# Patient Record
Sex: Female | Born: 1965 | Race: Black or African American | Hispanic: No | Marital: Single | Smoking: Never smoker
Health system: Southern US, Community
[De-identification: ages and names within clinical notes are randomized; demographics above are authoritative.]

## PROBLEM LIST (undated history)

## (undated) DIAGNOSIS — I10 Essential (primary) hypertension: Secondary | ICD-10-CM

## (undated) DIAGNOSIS — E119 Type 2 diabetes mellitus without complications: Secondary | ICD-10-CM

## (undated) HISTORY — DX: Essential (primary) hypertension: I10

## (undated) HISTORY — PX: TUBAL LIGATION: SHX77

## (undated) HISTORY — DX: Type 2 diabetes mellitus without complications: E11.9

---
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
## 2013-10-13 ENCOUNTER — Encounter: Payer: Managed Care, Other (non HMO) | Admitting: Nurse Practitioner

## 2013-11-03 ENCOUNTER — Ambulatory Visit (INDEPENDENT_AMBULATORY_CARE_PROVIDER_SITE_OTHER): Payer: Managed Care, Other (non HMO) | Admitting: Nurse Practitioner

## 2013-11-03 ENCOUNTER — Encounter: Payer: Self-pay | Admitting: Nurse Practitioner

## 2013-11-03 VITALS — BP 120/66 | HR 80 | Resp 16 | Ht 64.5 in | Wt 209.0 lb

## 2013-11-03 DIAGNOSIS — Z0000 Encounter for general adult medical examination without abnormal findings: Secondary | ICD-10-CM

## 2013-11-03 DIAGNOSIS — Z01419 Encounter for gynecological examination (general) (routine) without abnormal findings: Secondary | ICD-10-CM

## 2013-11-03 DIAGNOSIS — E559 Vitamin D deficiency, unspecified: Secondary | ICD-10-CM

## 2013-11-03 DIAGNOSIS — E119 Type 2 diabetes mellitus without complications: Secondary | ICD-10-CM

## 2013-11-03 LAB — POCT URINALYSIS DIPSTICK
Bilirubin, UA: NEGATIVE
Glucose, UA: NEGATIVE
Ketones, UA: NEGATIVE
Leukocytes, UA: NEGATIVE
Protein, UA: NEGATIVE
pH, UA: 7

## 2013-11-03 NOTE — Progress Notes (Signed)
Patient ID: Cassandra Morgan, female   DOB: 08-Jan-1966, 47 y.o.   MRN: [id number] 47 y.o. [id number] Single African American Fe here for annual exam.  Menses lasting 3 days moderate to light.  No cramps but some PMS. Some migraine headaches in the past but not as bad as earlier years. Same partner for 10 years and they have relocated from [place] secondary to his job.  She was diagnosed as pre diabetic last year.  She would like to establish care with endocrinologist here.  Patient's last menstrual period was 10/31/2013.          Sexually active: yes  The current method of family planning is tubal ligation.    Exercising: no  The patient does not participate in regular exercise at present. Smoker:  no  Health Maintenance: Pap:  2+ years, no history of abnormal MMG:  2013, in [place] TDaP:  2012 Labs: HB: 13.1 Urine: Mod blood, pH 7.0   reports that she has never smoked. She has never used smokeless tobacco. She reports that she drinks about 1.5 ounces of alcohol per week. She reports that she does not use illicit drugs.  Past Medical History  Diagnosis Date  . Diabetes     diagnosed as prediabetic 2013    Past Surgical History  Procedure Laterality Date  . Tubal ligation    . Inguinal hernia repair Right 1987    Current Outpatient Prescriptions  Medication Sig Dispense Refill  . cholecalciferol (VITAMIN D) 1000 UNITS tablet Take 1,000 Units by mouth daily.       No current facility-administered medications for this visit.    Family History  Problem Relation Age of Onset  . Diabetes Mother   . Diabetes Father   . Diabetes Sister     ROS:  Pertinent items are noted in HPI.  Otherwise, a comprehensive ROS was negative.  Exam:   BP 120/66  Pulse 80  Resp 16  Ht 5' 4.5" (1.638 m)  Wt 209 lb (94.802 kg)  BMI 35.33 kg/m2  LMP 10/31/2013 Height: 5' 4.5" (163.8 cm)  Ht Readings from Last 3 Encounters:  11/03/13 5' 4.5" (1.638 m)    General appearance: alert,  cooperative and appears stated age Head: Normocephalic, without obvious abnormality, atraumatic Neck: no adenopathy, supple, symmetrical, trachea midline and thyroid normal to inspection and palpation Lungs: clear to auscultation bilaterally Breasts: normal appearance, no masses or tenderness Heart: regular rate and rhythm Abdomen: soft, non-tender; no masses,  no organomegaly Extremities: extremities normal, atraumatic, no cyanosis or edema Skin: Skin color, texture, turgor normal. No rashes or lesions Lymph nodes: Cervical, supraclavicular, and axillary nodes normal. No abnormal inguinal nodes palpated Neurologic: Grossly normal   Pelvic: External genitalia:  no lesions              Urethra:  normal appearing urethra with no masses, tenderness or lesions              Bartholin's and Skene's: normal                 Vagina: normal appearing vagina with normal color and discharge, no lesions              Cervix: anteverted              Pap taken: yes Bimanual Exam:  Uterus:  normal size, contour, position, consistency, mobility, non-tender              Adnexa: no mass, fullness, tenderness  Rectovaginal: Confirms               Anus:  normal sphincter tone, no lesions  A:  Well Woman with normal exam  S/P BTL  Pre diabetic, with strong FMH of DM  Vit D deficiency  P:   Pap smear as per guidelines done today   Mammogram due now and will schedule  Referral to Dr. Talmage Nap to follow with Diabetes  Counseled on breast self exam, adequate intake of calcium and vitamin D, diet and exercise return annually or prn  An After Visit Summary was printed and given to the patient.

## 2013-11-03 NOTE — Patient Instructions (Signed)

## 2013-11-04 LAB — VITAMIN D 25 HYDROXY (VIT D DEFICIENCY, FRACTURES): Vit D, 25-Hydroxy: 25 ng/mL — ABNORMAL LOW (ref 30–89)

## 2013-11-04 NOTE — Progress Notes (Signed)
Encounter reviewed by Dr. Manley Fason Silva.  

## 2013-11-05 ENCOUNTER — Other Ambulatory Visit: Payer: Self-pay | Admitting: Nurse Practitioner

## 2013-11-05 DIAGNOSIS — Z1231 Encounter for screening mammogram for malignant neoplasm of breast: Secondary | ICD-10-CM

## 2013-11-05 LAB — IPS PAP TEST WITH HPV

## 2013-11-11 ENCOUNTER — Other Ambulatory Visit: Payer: Self-pay | Admitting: Nurse Practitioner

## 2013-11-11 MED ORDER — VITAMIN D (ERGOCALCIFEROL) 1.25 MG (50000 UNIT) PO CAPS: 50000.0000 [IU] | ORAL_CAPSULE | ORAL | Status: DC

## 2013-11-11 NOTE — Progress Notes (Signed)
Vitamin D rx sent per result note orders.

## 2013-11-17 ENCOUNTER — Encounter: Payer: Self-pay | Admitting: Certified Nurse Midwife

## 2013-11-17 ENCOUNTER — Telehealth: Payer: Self-pay | Admitting: Certified Nurse Midwife

## 2013-11-17 ENCOUNTER — Ambulatory Visit (INDEPENDENT_AMBULATORY_CARE_PROVIDER_SITE_OTHER): Payer: Managed Care, Other (non HMO) | Admitting: Certified Nurse Midwife

## 2013-11-17 ENCOUNTER — Ambulatory Visit: Payer: Managed Care, Other (non HMO) | Admitting: Certified Nurse Midwife

## 2013-11-17 VITALS — BP 120/78 | HR 68 | Resp 16 | Ht 64.75 in | Wt 212.0 lb

## 2013-11-17 DIAGNOSIS — N390 Urinary tract infection, site not specified: Secondary | ICD-10-CM

## 2013-11-17 DIAGNOSIS — B373 Candidiasis of vulva and vagina: Secondary | ICD-10-CM

## 2013-11-17 LAB — POCT URINALYSIS DIPSTICK
Bilirubin, UA: NEGATIVE
Glucose, UA: NEGATIVE
Nitrite, UA: NEGATIVE
pH, UA: 5

## 2013-11-17 MED ORDER — FLUCONAZOLE 150 MG PO TABS: ORAL_TABLET | ORAL | Status: DC

## 2013-11-17 NOTE — Progress Notes (Signed)
S:  47 y.o.Single Caucasian female presents with UTI  symptoms of urinary frequency, urinary urgency. Onset  11/07/13.  Patient denies urinary pain, fever or chills. Complaining of occasional itching with slight discharge. No new personal products. No STD concerns or testing needed. Not related to coitus. ROS: feels well  O alert, oriented to person, place, and time  Skin warm and dry   CVAT negative bilateral  Abdomen: non tender, negative suprapubic  Pelvic exam: BUS, urethral meatus and bladder not tender, external genital area no lesions  Vagina white discharge, no odor, wet prep taken  Cervix: non tender,normal  Uterus: normal, non tender  Adnexa: no masses, non tender   Wet prep: positive for yeast  Poct urine-wbc trace  Assessment: UTI not symptomatic now Yeast vaginitis probable cause of frequency P: Reviewed findings of no evidence of UTI, but will send culture. Lab: Urine culture Reviewed findings of yeast and should improve with Rx. Rx Diflucan 150 mg see order  Maintain adequate hydration. Follow up if symptoms not improving, and as needed.    Rv prn

## 2013-11-17 NOTE — Telephone Encounter (Signed)
Patient moved her 10:30 appt to 4:00 today

## 2013-11-18 NOTE — Progress Notes (Signed)
Note reviewed, agree with plan.  Chanler Schreiter, MD  

## 2013-11-20 LAB — URINE CULTURE

## 2013-11-24 ENCOUNTER — Other Ambulatory Visit: Payer: Self-pay | Admitting: *Deleted

## 2013-11-24 MED ORDER — CIPROFLOXACIN HCL 500 MG PO TABS: 500.0000 mg | ORAL_TABLET | Freq: Two times a day (BID) | ORAL | Status: DC

## 2013-11-24 NOTE — Progress Notes (Signed)
Pt notified of culture results.  Pt voices understanding and is agreeable with plan.  Cipro called to [facility] per result note.

## 2014-10-25 ENCOUNTER — Encounter: Payer: Self-pay | Admitting: Certified Nurse Midwife

## 2014-11-04 ENCOUNTER — Ambulatory Visit: Payer: Managed Care, Other (non HMO) | Admitting: Nurse Practitioner

## 2014-11-04 ENCOUNTER — Encounter: Payer: Self-pay | Admitting: Nurse Practitioner

## 2014-12-06 DIAGNOSIS — E1169 Type 2 diabetes mellitus with other specified complication: Secondary | ICD-10-CM | POA: Insufficient documentation

## 2014-12-06 DIAGNOSIS — E785 Hyperlipidemia, unspecified: Secondary | ICD-10-CM

## 2015-05-25 ENCOUNTER — Other Ambulatory Visit: Payer: Self-pay | Admitting: Nurse Practitioner

## 2015-05-25 DIAGNOSIS — Z1231 Encounter for screening mammogram for malignant neoplasm of breast: Secondary | ICD-10-CM

## 2015-07-13 ENCOUNTER — Ambulatory Visit (INDEPENDENT_AMBULATORY_CARE_PROVIDER_SITE_OTHER): Payer: Managed Care, Other (non HMO) | Admitting: Nurse Practitioner

## 2015-07-13 ENCOUNTER — Encounter: Payer: Self-pay | Admitting: Nurse Practitioner

## 2015-07-13 VITALS — BP 100/60 | HR 80 | Ht 64.25 in | Wt 208.0 lb

## 2015-07-13 DIAGNOSIS — Z0000 Encounter for general adult medical examination without abnormal findings: Secondary | ICD-10-CM | POA: Diagnosis not present

## 2015-07-13 DIAGNOSIS — Z01419 Encounter for gynecological examination (general) (routine) without abnormal findings: Secondary | ICD-10-CM | POA: Diagnosis not present

## 2015-07-13 NOTE — Progress Notes (Signed)
Patient ID: Cassandra Morgan, female   DOB: [birth year], 49 y.o.   MRN: [id number] 49 y.o. [id number] Married  African American Fe here for annual exam.  Now diagnosed with diabetes for 09/2014.  Menses is regular lasting 3-4 days. Now increase in migraine headaches 2 days before menses.  Excedrin Migraine prn.  She is currently being treated for fungal infection with a medicated powder under the breast.   They are quite heavy and large.  She often times has neck and shoulder pain from this.  Patient's last menstrual period was 06/29/2015 (exact date).          Sexually active: Yes.    The current method of family planning is tubal ligation.    Exercising: Yes.    walking everyday at work Smoker:  no  Health Maintenance: Pap:  11/03/13, Negative with neg HR HPV MMG:  06/30/15, Bi-Rads 1:  Negative TDaP:  2012 Labs:  HB:  05/2015 with PCP (Care Everywhere)  Urine:  + glucose   reports that she has never smoked. She has never used smokeless tobacco. She reports that she drinks about 1.5 - 2.0 oz of alcohol per week. She reports that she does not use illicit drugs.  Past Medical History  Diagnosis Date  . Diabetes     diagnosed as prediabetic 2013    Past Surgical History  Procedure Laterality Date  . Tubal ligation    . Inguinal hernia repair Right 1987    Current Outpatient Prescriptions  Medication Sig Dispense Refill  . atorvastatin (LIPITOR) 10 MG tablet Take 10 mg by mouth daily.    . empagliflozin (JARDIANCE) 10 MG TABS tablet Take 10 mg by mouth daily.     No current facility-administered medications for this visit.    Family History  Problem Relation Age of Onset  . Diabetes Mother   . Diabetes Father   . Diabetes Sister   . Diabetes Other     ROS:  Pertinent items are noted in HPI.  Otherwise, a comprehensive ROS was negative.  Exam:   BP 100/60 mmHg  Pulse 80  Ht 5' 4.25" (1.632 m)  Wt 208 lb (94.348 kg)  BMI 35.42 kg/m2  LMP 06/29/2015 (Exact Date) Height: 5'  4.25" (163.2 cm) Ht Readings from Last 3 Encounters:  07/13/15 5' 4.25" (1.632 m)  11/17/13 5' 4.75" (1.645 m)  11/03/13 5' 4.5" (1.638 m)    General appearance: alert, cooperative and appears stated age Head: Normocephalic, without obvious abnormality, atraumatic Neck: no adenopathy, supple, symmetrical, trachea midline and thyroid normal to inspection and palpation Lungs: clear to auscultation bilaterally Breasts: normal appearance, no masses or tenderness, red prickly rash under both breast Heart: regular rate and rhythm Abdomen: soft, non-tender; no masses,  no organomegaly Extremities: extremities normal, atraumatic, no cyanosis or edema Skin: Skin color, texture, turgor normal. No rashes or lesions  Areas of dry irritative rash mid back from bra wear. Lymph nodes: Cervical, supraclavicular, and axillary nodes normal. No abnormal inguinal nodes palpated Neurologic: Grossly normal   Pelvic: External genitalia:  no lesions              Urethra:  normal appearing urethra with no masses, tenderness or lesions              Bartholin's and Skene's: normal                 Vagina: normal appearing vagina with normal color and discharge, no lesions  Cervix: anteverted              Pap taken: Yes.   Bimanual Exam:  Uterus:  normal size, contour, position, consistency, mobility, non-tender              Adnexa: no mass, fullness, tenderness               Rectovaginal: Confirms               Anus:  normal sphincter tone, no lesions  Chaperone present:  yes  A:  Well Woman with normal exam  S/P BTL   New diagnosis of  Diabetes 09/2014   Strong FMH of DM  Vit D deficiency  Fungal infection under breast   P:   Reviewed health and wellness pertinent to exam  Pap smear as above  Mammogram is due 06/2016  Follow with pap  Counseled on breast self exam, mammography screening, adequate intake of calcium and vitamin D, diet and exercise return  annually or prn  An After Visit Summary was printed and given to the patient.

## 2015-07-13 NOTE — Patient Instructions (Signed)

## 2015-07-15 LAB — IPS PAP TEST WITH HPV

## 2015-07-20 NOTE — Progress Notes (Signed)
Encounter reviewed by Dr. Dom Haverland Amundson C. Silva.  

## 2015-12-02 DIAGNOSIS — F419 Anxiety disorder, unspecified: Secondary | ICD-10-CM | POA: Insufficient documentation

## 2015-12-02 DIAGNOSIS — F331 Major depressive disorder, recurrent, moderate: Secondary | ICD-10-CM | POA: Insufficient documentation

## 2016-07-20 ENCOUNTER — Ambulatory Visit: Payer: Managed Care, Other (non HMO) | Admitting: Nurse Practitioner

## 2016-09-03 ENCOUNTER — Ambulatory Visit: Payer: Managed Care, Other (non HMO) | Admitting: Nurse Practitioner

## 2016-09-03 ENCOUNTER — Telehealth: Payer: Self-pay | Admitting: Nurse Practitioner
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
## 2016-10-22 ENCOUNTER — Ambulatory Visit: Payer: Managed Care, Other (non HMO) | Admitting: Nurse Practitioner

## 2017-02-08 ENCOUNTER — Encounter: Payer: Self-pay | Admitting: Nurse Practitioner

## 2017-02-08 ENCOUNTER — Ambulatory Visit: Payer: Managed Care, Other (non HMO) | Admitting: Nurse Practitioner

## 2017-02-08 NOTE — Progress Notes (Deleted)
Patient ID: Cassandra Morgan, female   DOB: Feb 05, 1966, 51 y.o.   MRN: [id number]  51 y.o. [id number] Single  {Race/ethnicity:17218} Fe here for annual exam.    No LMP recorded.          Sexually active: {yes no:314532}  The current method of family planning is tubal ligation.    Exercising: {yes no:314532}  {types:19826} Smoker:  no  Health Maintenance: Pap: 07/13/15, Negative with neg HR HPV  11/03/13, Negative with neg HR HPV MMG: 06/30/15, Bi-Rads 1: Negative (Care Everywhere) TDaP: 2012 Shingles: Not indicated due to age Pneumonia: 09/23/12 Pneumovax Hep C and HIV: *** Labs: ***   reports that she has never smoked. She has never used smokeless tobacco. She reports that she drinks about 1.5 - 2.0 oz of alcohol per week . She reports that she does not use drugs.  Past Medical History:  Diagnosis Date  . Diabetes [facility](HCC)    diagnosed as prediabetic 2013    Past Surgical History:  Procedure Laterality Date  . INGUINAL HERNIA REPAIR Right 1987  . TUBAL LIGATION      Current Outpatient Prescriptions  Medication Sig Dispense Refill  . atorvastatin (LIPITOR) 10 MG tablet Take 10 mg by mouth daily.    . empagliflozin (JARDIANCE) 10 MG TABS tablet Take 10 mg by mouth daily.     No current facility-administered medications for this visit.     Family History  Problem Relation Age of Onset  . Diabetes Mother   . Diabetes Father   . Diabetes Sister   . Diabetes Other     ROS:  Pertinent items are noted in HPI.  Otherwise, a comprehensive ROS was negative.  Exam:   There were no vitals taken for this visit.   Ht Readings from Last 3 Encounters:  07/13/15 5' 4.25" (1.632 m)  11/17/13 5' 4.75" (1.645 m)  11/03/13 5' 4.5" (1.638 m)    General appearance: alert, cooperative and appears stated age Head: Normocephalic, without obvious abnormality, atraumatic Neck: no adenopathy, supple, symmetrical, trachea midline and thyroid {EXAM; THYROID:18604} Lungs: clear to auscultation  bilaterally Breasts: {Exam; breast:13139::"normal appearance, no masses or tenderness"} Heart: regular rate and rhythm Abdomen: soft, non-tender; no masses,  no organomegaly Extremities: extremities normal, atraumatic, no cyanosis or edema Skin: Skin color, texture, turgor normal. No rashes or lesions Lymph nodes: Cervical, supraclavicular, and axillary nodes normal. No abnormal inguinal nodes palpated Neurologic: Grossly normal   Pelvic: External genitalia:  no lesions              Urethra:  normal appearing urethra with no masses, tenderness or lesions              Bartholin's and Skene's: normal                 Vagina: normal appearing vagina with normal color and discharge, no lesions              Cervix: {exam; cervix:14595}              Pap taken: {yes no:314532} Bimanual Exam:  Uterus:  {exam; uterus:12215}              Adnexa: {exam; adnexa:12223}               Rectovaginal: Confirms               Anus:  normal sphincter tone, no lesions  Chaperone present: ***  A:  Well Woman with normal exam  P:  Reviewed health and wellness pertinent to exam  Pap smear as above  {plan; gyn:5269::"mammogram","pap smear","return annually or prn"}  An After Visit Summary was printed and given to the patient.

## 2017-02-18 NOTE — Progress Notes (Signed)
Patient ID: Cassandra Morgan, female   DOB: [birth year], 51 y.o.   MRN: [id number]  51 y.o. [id number] Single  African American Fe here for annual exam.  In December had breast tenderness and saw PCP.  She was advised to get Mammogram but did not due to work stress at that time.  She also later realized it was a few days prior to cycle.  She is going to call and get that rescheduled as she is now due to regular Mammo.  This is done in [place].  Menses now at 4 days.  Skipped cycle in January of this year.  Then February did not bleed heavier but did have increase in HA's.  Some breast tenderness with cycle normally.  Same partner who is the father of her children.  She is now back in [place] working with [employer] from Saturday- Tuesday.  Then goes back to [place] to be with her family rest of the week.  She has small apt here and home is there.    Patient's last menstrual period was 01/24/2017 (exact date).          Sexually active: Yes.    The current method of family planning is tubal ligation.    Exercising: No.  The patient does not participate in regular exercise at present. Smoker:  no  Health Maintenance: Pap: 07/13/15, Negative with neg HR HPV  11/03/13, Negative with neg HR HPV MMG: 06/30/2015, negative per patient, in [place] TDaP:  2012 Pneumonia: 09/23/12, Pneumovax HIV: 2002 with pregnancy Labs: PCP takes care of all labs (10/2016)   reports that she has never smoked. She has never used smokeless tobacco. She reports that she drinks about 1.5 - 2.0 oz of alcohol per week . She reports that she does not use drugs.  Past Medical History:  Diagnosis Date  . Diabetes [facility])    diagnosed as prediabetic 2013    Past Surgical History:  Procedure Laterality Date  . INGUINAL HERNIA REPAIR Right 1987  . TUBAL LIGATION      Current Outpatient Prescriptions  Medication Sig Dispense Refill  . atorvastatin (LIPITOR) 20 MG tablet Take 1 tablet by mouth at bedtime.    Marland Kitchen FARXIGA 10 MG  TABS tablet Take 1 tablet by mouth daily.    . metFORMIN (GLUCOPHAGE) 1000 MG tablet Take 1 tablet by mouth 2 (two) times daily.     No current facility-administered medications for this visit.     Family History  Problem Relation Age of Onset  . Diabetes Mother   . Diabetes Father   . Diabetes Sister   . Diabetes Sister   . Diabetes Other     ROS:  Pertinent items are noted in HPI.  Otherwise, a comprehensive ROS was negative.  Exam:   BP 120/66 (BP Location: Right Arm, Patient Position: Sitting, Cuff Size: Large)   Pulse 72   Ht 5\' 4"  (1.626 m)   Wt 206 lb (93.4 kg)   LMP 01/24/2017 (Exact Date)   BMI 35.36 kg/m  Height: 5\' 4"  (162.6 cm) Ht Readings from Last 3 Encounters:  02/19/17 5\' 4"  (1.626 m)  07/13/15 5' 4.25" (1.632 m)  11/17/13 5' 4.75" (1.645 m)    General appearance: alert, cooperative and appears stated age Head: Normocephalic, without obvious abnormality, atraumatic Neck: no adenopathy, supple, symmetrical, trachea midline and thyroid normal to inspection and palpation Lungs: clear to auscultation bilaterally Breasts: normal appearance, no masses or tenderness Heart: regular rate and rhythm Abdomen: soft, non-tender; no masses,  no organomegaly Extremities: extremities normal, atraumatic, no cyanosis or edema Skin: Skin color, texture, turgor normal. No rashes or lesions Lymph nodes: Cervical, supraclavicular, and axillary nodes normal. No abnormal inguinal nodes palpated Neurologic: Grossly normal   Pelvic: External genitalia:  no lesions              Urethra:  normal appearing urethra with no masses, tenderness or lesions              Bartholin's and Skene's: normal                 Vagina: normal appearing vagina with normal color and discharge, no lesions              Cervix: anteverted              Pap taken: No. Bimanual Exam:  Uterus:  normal size, contour, position, consistency, mobility, non-tender              Adnexa: no mass, fullness,  tenderness               Rectovaginal: Confirms               Anus:  normal sphincter tone, no lesions  Chaperone present: yes  A:  Well Woman with normal exam   S/P BTL  New diagnosis of  Diabetes 09/2014 - followed by PCP             Strong FMH of DM Vit D deficiency              P:   Reviewed health and wellness pertinent to exam  Pap smear not done  Mammogram is due and will schedule  Counseled on breast self exam, mammography screening, adequate intake of calcium and vitamin D, diet and exercise, Kegel's exercises return annually or prn  An After Visit Summary was printed and given to the patient.

## 2017-02-19 ENCOUNTER — Encounter: Payer: Self-pay | Admitting: Nurse Practitioner

## 2017-02-19 ENCOUNTER — Ambulatory Visit (INDEPENDENT_AMBULATORY_CARE_PROVIDER_SITE_OTHER): Payer: 59 | Admitting: Nurse Practitioner

## 2017-02-19 VITALS — BP 120/66 | HR 72 | Ht 64.0 in | Wt 206.0 lb

## 2017-02-19 DIAGNOSIS — Z01419 Encounter for gynecological examination (general) (routine) without abnormal findings: Secondary | ICD-10-CM | POA: Diagnosis not present

## 2017-02-19 DIAGNOSIS — Z0000 Encounter for general adult medical examination without abnormal findings: Secondary | ICD-10-CM

## 2017-02-21 NOTE — Patient Instructions (Signed)

## 2017-02-24 NOTE — Progress Notes (Signed)
Encounter reviewed by Dr. Brook Amundson C. Silva.  

## 2017-05-29 ENCOUNTER — Ambulatory Visit (INDEPENDENT_AMBULATORY_CARE_PROVIDER_SITE_OTHER): Payer: 59 | Admitting: Nurse Practitioner

## 2017-05-29 ENCOUNTER — Encounter: Payer: Self-pay | Admitting: Nurse Practitioner

## 2017-05-29 VITALS — BP 120/62 | HR 72 | Resp 16 | Ht 64.0 in | Wt 205.8 lb

## 2017-05-29 DIAGNOSIS — R8290 Unspecified abnormal findings in urine: Secondary | ICD-10-CM | POA: Diagnosis not present

## 2017-05-29 DIAGNOSIS — N926 Irregular menstruation, unspecified: Secondary | ICD-10-CM

## 2017-05-29 LAB — POCT URINALYSIS DIPSTICK
Bilirubin, UA: NEGATIVE
KETONES UA: NEGATIVE
NITRITE UA: NEGATIVE
PH UA: 6 (ref 5.0–8.0)
PROTEIN UA: NEGATIVE
Urobilinogen, UA: 0.2 E.U./dL

## 2017-05-29 LAB — POCT URINE PREGNANCY: PREG TEST UR: NEGATIVE

## 2017-05-29 MED ORDER — NORETHINDRONE 0.35 MG PO TABS: 1.0000 | ORAL_TABLET | Freq: Every day | ORAL | 0 refills | Status: DC

## 2017-05-29 NOTE — Patient Instructions (Signed)
Dysfunctional Uterine Bleeding °Dysfunctional uterine bleeding is abnormal bleeding from the uterus. Dysfunctional uterine bleeding includes: °· A period that comes earlier or later than usual. °· A period that is lighter, heavier, or has blood clots. °· Bleeding between periods. °· Skipping one or more periods. °· Bleeding after sexual intercourse. °· Bleeding after menopause. ° °Follow these instructions at home: °Pay attention to any changes in your symptoms. Follow these instructions to help with your condition: °Eating and drinking °· Eat well-balanced meals. Include foods that are high in iron, such as liver, meat, shellfish, green leafy vegetables, and eggs. °· If you become constipated: °? Drink plenty of water. °? Eat fruits and vegetables that are high in water and fiber, such as spinach, carrots, raspberries, apples, and mango. °Medicines °· Take over-the-counter and prescription medicines only as told by your health care provider. °· Do not change medicines without talking with your health care provider. °· Aspirin or medicines that contain aspirin may make the bleeding worse. Do not take those medicines: °? During the week before your period. °? During your period. °· If you were prescribed iron pills, take them as told by your health care provider. Iron pills help to replace iron that your body loses because of this condition. °Activity °· If you need to change your sanitary pad or tampon more than one time every 2 hours: °? Lie in bed with your feet raised (elevated). °? Place a cold pack on your lower abdomen. °? Rest as much as possible until the bleeding stops or slows down. °· Do not try to lose weight until the bleeding has stopped and your blood iron level is back to normal. °Other Instructions °· For two months, write down: °? When your period starts. °? When your period ends. °? When any abnormal bleeding occurs. °? What problems you notice. °· Keep all follow up visits as told by your health  care provider. This is important. °Contact a health care provider if: °· You get light-headed or weak. °· You have nausea and vomiting. °· You cannot eat or drink without vomiting. °· You feel dizzy or have diarrhea while you are taking medicines. °· You are taking birth control pills or hormones, and you want to change them or stop taking them. °Get help right away if: °· You develop a fever or chills. °· You need to change your sanitary pad or tampon more than one time per hour. °· Your bleeding becomes heavier, or your flow contains clots more often. °· You develop pain in your abdomen. °· You lose consciousness. °· You develop a rash. °This information is not intended to replace advice given to you by your health care provider. Make sure you discuss any questions you have with your health care provider. °Document Released: 12/07/2000 Document Revised: 05/17/2016 Document Reviewed: 03/07/2015 °Elsevier Interactive Patient Education © 2018 [employer] ° °

## 2017-05-29 NOTE — Progress Notes (Signed)
GYNECOLOGY  VISIT   HPI: 51 y.o.   Single  African American  female  [phone] with Patient's last menstrual period was 05/27/2017.  Pt presents with history of irregular menses.  When here in 2/18 she reported having skipped menses in January but heavier menses in February.  For the previous year had missed maybe 1-2 cycles.  Now she reports no  menses in March, April, or May.  Then on 6/4 started a cycle with moderate flow.  Cycle normally last X 4 days.  She did not particularly feel that she was about to start a cycle on the months with amenorrhea - and has not had any vaso symptoms.  She did get a mammogram with her PCP in [place]. She continues to go back and forth from here to [place] for her job.  She does not note any other stressors or life changes.  She is S/P BTL.  Same partner who is the father of her children.  She did have a dental procedure done 2 wk's ago and took a high dose of Amoxil (due to knee replacement) and she did get a yeast infection.  She was able to treat with OTC Monistat with good results.  She has noted an odor to her urine but denies dysuria, frequency or urgency.  Denies back pain.  She is a diabetic and followed by PCP and thinks her HGB AIC was a little elevated.  She has been on Lisinopril and now off for a short time as she did not feel she needed this with a normal BP.  Now understands this is needed for her DM.  She does have concerns about thyroid as cause of irregular bleeding    GYNECOLOGIC HISTORY: Patient's last menstrual period was 05/27/2017. Contraception:  S/P BTL        OB History    Gravida Para Term Preterm AB Living   4 2 2  0 2 2   SAB TAB Ectopic Multiple Live Births   1 1 0 0 2         Patient Active Problem List   Diagnosis Date Noted  . Diabetes mellitus, type 2 (HCC) 12/06/2014    Past Medical History:  Diagnosis Date  . Diabetes [facility])    diagnosed as prediabetic 2013    Past Surgical History:  Procedure Laterality Date  .  INGUINAL HERNIA REPAIR Right 1987  . TUBAL LIGATION      Current Outpatient Prescriptions  Medication Sig Dispense Refill  . FARXIGA 10 MG TABS tablet Take 1 tablet by mouth daily.    . metFORMIN (GLUCOPHAGE) 1000 MG tablet Take 1 tablet by mouth 2 (two) times daily.     No current facility-administered medications for this visit.      ALLERGIES: Metformin and related  Family History  Problem Relation Age of Onset  . Diabetes Mother   . Diabetes Father   . Diabetes Sister   . Diabetes Sister   . Diabetes Other     Social History   Social History  . Marital status: Single    Spouse name: N/A  . Number of children: 2  . Years of education: N/A   Occupational History  . Not on file.   Social History Main Topics  . Smoking status: Never Smoker  . Smokeless tobacco: Never Used  . Alcohol use 1.5 - 2.0 oz/week    3 - 4 Standard drinks or equivalent per week  . Drug use: No  . Sexual activity:  Yes    Partners: Male    Birth control/ protection: Surgical     Comment: BTL   Other Topics Concern  . Not on file   Social History Narrative   Now working in [place].    ROS  PHYSICAL EXAMINATION:    BP 120/62 (BP Location: Right Arm, Patient Position: Sitting, Cuff Size: Normal)   Pulse 72   Resp 16   Ht 5\' 4"  (1.626 m)   Wt 205 lb 12.8 oz (93.4 kg)   LMP 05/27/2017   BMI 35.33 kg/m     General appearance: alert, cooperative and appears stated age  Neck: no adenopathy, supple, symmetrical, trachea midline and thyroid normal to inspection and palpation.  Pelvic: External genitalia:  no lesions              Urethra:  normal appearing urethra with no masses, tenderness or lesions               Vagina: normal appearing vagina with normal color and moderate blood, no lesions              Cervix: anteverted and no lesions and cervical os is closed              Bimanual Exam:  Uterus:  normal size, contour, position, consistency, mobility, non-tender               Adnexa: no mass, fullness, tenderness   Urine chemstrip:  Large RBC, 2+; trace leuk's - on menses  UPT: negative          Chaperone was present for exam.  ASSESSMENT  Perimenopausal c/w irregular menses Amenorrhea X 3 months - now regular cycle History of DM   PLAN Discussion about irregular menses and being perimenopausal.  Discussed as an option of treatment to use POP to help regulate and or decrease the menses flow.  Also discussed concerns about endometrial hyperplasia with her history of DM and BMI of 35.33.  She is wiling to try POP and will have her to return in 3 months for a consult and menses calendar.  If any further irregular or heavy menses would like for her to get PUS.  Will check her TSH and follow.   An After Visit Summary was printed and given to the patient.

## 2017-05-30 NOTE — Progress Notes (Signed)
Encounter reviewed Jill Jertson, MD   

## 2017-06-03 ENCOUNTER — Other Ambulatory Visit (INDEPENDENT_AMBULATORY_CARE_PROVIDER_SITE_OTHER): Payer: 59

## 2017-06-03 DIAGNOSIS — N926 Irregular menstruation, unspecified: Secondary | ICD-10-CM

## 2017-06-04 LAB — TSH: TSH: 1.75 u[IU]/mL (ref 0.450–4.500)

## 2017-07-08 ENCOUNTER — Telehealth: Payer: Self-pay | Admitting: Obstetrics and Gynecology

## 2017-07-08 NOTE — Telephone Encounter (Signed)
Left patient a message to call back to reschedule a future appointment that was cancelled by the provider (PG) for a three month recheck and consultation.

## 2017-08-05 NOTE — Telephone Encounter (Signed)
Called and left patient a message to call back and reschedule her cancelled appointment.

## 2017-08-23 NOTE — Telephone Encounter (Signed)
Patient has not returned calls to reschedule her appointment for a three month recheck. Routing to provider for review.

## 2017-08-23 NOTE — Telephone Encounter (Signed)
I recommend sending a letter to patient recommending follow up and explaining that Shirlyn Goltzatty Grubb has retired.   Cc- Starla Curl

## 2017-08-28 NOTE — Telephone Encounter (Signed)
Letter written and to Dr.Miller for review before sending as Dr.Silva is out of the office.

## 2017-08-29 NOTE — Telephone Encounter (Signed)
Signed letter mailed to patient's home address on file.  Routing to covering provider for final review. Patient agreeable to disposition. Will close encounter.

## 2017-09-03 ENCOUNTER — Ambulatory Visit: Payer: 59 | Admitting: Nurse Practitioner

## 2018-02-21 ENCOUNTER — Ambulatory Visit: Payer: 59 | Admitting: Certified Nurse Midwife

## 2018-02-25 ENCOUNTER — Encounter: Payer: Self-pay | Admitting: Infectious Diseases

## 2018-02-26 ENCOUNTER — Other Ambulatory Visit (HOSPITAL_COMMUNITY)
Admission: RE | Admit: 2018-02-26 | Discharge: 2018-02-26 | Disposition: A | Discharge status: Home / Self Care | Payer: 59 | Source: Ambulatory Visit | Attending: Certified Nurse Midwife | Admitting: Certified Nurse Midwife

## 2018-02-26 ENCOUNTER — Other Ambulatory Visit: Payer: Self-pay

## 2018-02-26 ENCOUNTER — Encounter: Payer: Self-pay | Admitting: Certified Nurse Midwife

## 2018-02-26 ENCOUNTER — Ambulatory Visit: Payer: 59 | Admitting: Certified Nurse Midwife

## 2018-02-26 VITALS — BP 112/70 | HR 68 | Resp 16 | Ht 64.25 in | Wt 196.0 lb

## 2018-02-26 DIAGNOSIS — J310 Chronic rhinitis: Secondary | ICD-10-CM | POA: Insufficient documentation

## 2018-02-26 DIAGNOSIS — N926 Irregular menstruation, unspecified: Secondary | ICD-10-CM

## 2018-02-26 DIAGNOSIS — Z8639 Personal history of other endocrine, nutritional and metabolic disease: Secondary | ICD-10-CM | POA: Diagnosis not present

## 2018-02-26 DIAGNOSIS — Z01419 Encounter for gynecological examination (general) (routine) without abnormal findings: Secondary | ICD-10-CM

## 2018-02-26 DIAGNOSIS — K219 Gastro-esophageal reflux disease without esophagitis: Secondary | ICD-10-CM | POA: Insufficient documentation

## 2018-02-26 DIAGNOSIS — Z124 Encounter for screening for malignant neoplasm of cervix: Secondary | ICD-10-CM | POA: Insufficient documentation

## 2018-02-26 NOTE — Progress Notes (Signed)
52 y.o. [id number] Single  African American Fe here for annual exam. Periods have been irregular with last period in 02/13/18 and prior was 9/18 which was normal. Denies any hot flashes or night sweats. Saw Sigmund Hazel PCP for first time for aex and Hgb A1-c was 9 and now working on diet. Had mammogram last year with  [facility] and was normal in 2018. Has stopped all alcohol use in 12/18 and working on weight loss now. No STD concerns or screening. Recent exposure to flu with daughter who has had one treatment of Tamiflu. Patient declines symptoms. Aware if occur to see PCP. No other health issues today.  Patient's last menstrual period was 02/13/2018 (exact date).          Sexually active: Yes.    The current method of family planning is tubal ligation.    Exercising: Yes.    walking Smoker:  no  Health Maintenance: Pap:  07-13-15 neg HPV HR neg History of Abnormal Pap: no MMG:  2018 neg per patient Self Breast exams: yes Colonoscopy:  none BMD:   none TDaP:  2012 Shingles: not done Pneumonia: 2013 Hep C and HIV: done with pregnancy Labs: yes   reports that  has never smoked. she has never used smokeless tobacco. She reports that she drinks about 1.5 - 2.0 oz of alcohol per week. She reports that she does not use drugs.  Past Medical History:  Diagnosis Date  . Diabetes (HCC)    diagnosed as prediabetic 2013  . Hypertension     Past Surgical History:  Procedure Laterality Date  . INGUINAL HERNIA REPAIR Right 1987  . TUBAL LIGATION      Current Outpatient Medications  Medication Sig Dispense Refill  . lisinopril (PRINIVIL,ZESTRIL) 2.5 MG tablet Take 2.5 mg by mouth daily.    . metFORMIN (GLUCOPHAGE) 1000 MG tablet Take 1 tablet by mouth 2 (two) times daily.     No current facility-administered medications for this visit.     Family History  Problem Relation Age of Onset  . Diabetes Mother   . Diabetes Father   . Diabetes Sister   . Diabetes Sister   . Diabetes  Other     ROS:  Pertinent items are noted in HPI.  Otherwise, a comprehensive ROS was negative.  Exam:   BP 112/70   Pulse 68   Resp 16   Ht 5' 4.25" (1.632 m)   Wt 196 lb (88.9 kg)   LMP 02/13/2018 (Exact Date)   BMI 33.38 kg/m  Height: 5' 4.25" (163.2 cm) Ht Readings from Last 3 Encounters:  02/26/18 5' 4.25" (1.632 m)  05/29/17 5\' 4"  (1.626 m)  02/19/17 5\' 4"  (1.626 m)    General appearance: alert, cooperative and appears stated age Head: Normocephalic, without obvious abnormality, atraumatic Neck: no adenopathy, supple, symmetrical, trachea midline and thyroid normal to inspection and palpation Lungs: clear to auscultation bilaterally Breasts: normal appearance, no masses or tenderness, No nipple retraction or dimpling, No nipple discharge or bleeding, No axillary or supraclavicular adenopathy Heart: regular rate and rhythm Abdomen: soft, non-tender; no masses,  no organomegaly Extremities: extremities normal, atraumatic, no cyanosis or edema Skin: Skin color, texture, turgor normal. No rashes or lesions Lymph nodes: Cervical, supraclavicular, and axillary nodes normal. No abnormal inguinal nodes palpated Neurologic: Grossly normal   Pelvic: External genitalia:  no lesions              Urethra:  normal appearing urethra with no masses, tenderness  or lesions              Bartholin's and Skene's: normal                 Vagina: normal appearing vagina with normal color and discharge, no lesions              Cervix: multiparous appearance, no bleeding following Pap, no cervical motion tenderness and no lesions              Pap taken: Yes.   Bimanual Exam:  Uterus:  normal size, contour, position, consistency, mobility, non-tender              Adnexa: normal adnexa and no mass, fullness, tenderness               Rectovaginal: Confirms               Anus:  normal sphincter tone, no lesions  Chaperone present: yes  A:  Well Woman with normal exam  Contraception  Tubal  Irregular menstrual cycles for past year  Type 2 diabetes not in control, has established PCP now  Obesity on weight loss  Flu exposure, did not take vaccine  P:   Reviewed health and wellness pertinent to exam  Discussed importance of advising if no menses in 3 months due to concerns with heavy period or hyperplasia with no period. Given information regarding.  Lab: FSH, Prolactin  Stressed importance for follow up with PCP regarding diabetes management  Continue on weight loss journey.  Pap smear: yes   counseled on breast self exam, mammography screening, given  Information on where to go for screening, feminine hygiene, adequate intake of calcium and vitamin D, diet and exercise  return annually or prn  An After Visit Summary was printed and given to the patient.

## 2018-02-26 NOTE — Patient Instructions (Signed)
EXERCISE AND DIET:  We recommended that you start or continue a regular exercise program for good health. Regular exercise means any activity that makes your heart beat faster and makes you sweat.  We recommend exercising at least 30 minutes per day at least 3 days a week, preferably 4 or 5.  We also recommend a diet low in fat and sugar.  Inactivity, poor dietary choices and obesity can cause diabetes, heart attack, stroke, and kidney damage, among others.    ALCOHOL AND SMOKING:  Women should limit their alcohol intake to no more than 7 drinks/beers/glasses of wine (combined, not each!) per week. Moderation of alcohol intake to this level decreases your risk of breast cancer and liver damage. And of course, no recreational drugs are part of a healthy lifestyle.  And absolutely no smoking or even second hand smoke. Most people know smoking can cause heart and lung diseases, but did you know it also contributes to weakening of your bones? Aging of your skin?  Yellowing of your teeth and nails?  CALCIUM AND VITAMIN D:  Adequate intake of calcium and Vitamin D are recommended.  The recommendations for exact amounts of these supplements seem to change often, but generally speaking 600 mg of calcium (either carbonate or citrate) and 800 units of Vitamin D per day seems prudent. Certain women may benefit from higher intake of Vitamin D.  If you are among these women, your doctor will have told you during your visit.    PAP SMEARS:  Pap smears, to check for cervical cancer or precancers,  have traditionally been done yearly, although recent scientific advances have shown that most women can have pap smears less often.  However, every woman still should have a physical exam from her gynecologist every year. It will include a breast check, inspection of the vulva and vagina to check for abnormal growths or skin changes, a visual exam of the cervix, and then an exam to evaluate the size and shape of the uterus and  ovaries.  And after 50 years of age, a rectal exam is indicated to check for rectal cancers. We will also provide age appropriate advice regarding health maintenance, like when you should have certain vaccines, screening for sexually transmitted diseases, bone density testing, colonoscopy, mammograms, etc.   MAMMOGRAMS:  All women over 40 years old should have a yearly mammogram. Many facilities now offer a "3D" mammogram, which may cost around $50 extra out of pocket. If possible,  we recommend you accept the option to have the 3D mammogram performed.  It both reduces the number of women who will be called back for extra views which then turn out to be normal, and it is better than the routine mammogram at detecting truly abnormal areas.    COLONOSCOPY:  Colonoscopy to screen for colon cancer is recommended for all women at age 50.  We know, you hate the idea of the prep.  We agree, BUT, having colon cancer and not knowing it is worse!!  Colon cancer so often starts as a polyp that can be seen and removed at colonscopy, which can quite literally save your life!  And if your first colonoscopy is normal and you have no family history of colon cancer, most women don't have to have it again for 10 years.  Once every ten years, you can do something that may end up saving your life, right?  We will be happy to help you get it scheduled when you are ready.    Be sure to check your insurance coverage so you understand how much it will cost.  It may be covered as a preventative service at no cost, but you should check your particular policy.     Perimenopause Perimenopause is the time when your body begins to move into the menopause (no menstrual period for 12 straight months). It is a natural process. Perimenopause can begin 2-8 years before the menopause and usually lasts for 1 year after the menopause. During this time, your ovaries may or may not produce an egg. The ovaries vary in their production of estrogen and  progesterone hormones each month. This can cause irregular menstrual periods, difficulty getting pregnant, vaginal bleeding between periods, and uncomfortable symptoms. What are the causes?  Irregular production of the ovarian hormones, estrogen and progesterone, and not ovulating every month. Other causes include:  Tumor of the pituitary gland in the brain.  Medical disease that affects the ovaries.  Radiation treatment.  Chemotherapy.  Unknown causes.  Heavy smoking and excessive alcohol intake can bring on perimenopause sooner.  What are the signs or symptoms?  Hot flashes.  Night sweats.  Irregular menstrual periods.  Decreased sex drive.  Vaginal dryness.  Headaches.  Mood swings.  Depression.  Memory problems.  Irritability.  Tiredness.  Weight gain.  Trouble getting pregnant.  The beginning of losing bone cells (osteoporosis).  The beginning of hardening of the arteries (atherosclerosis). How is this diagnosed? Your health care provider will make a diagnosis by analyzing your age, menstrual history, and symptoms. He or she will do a physical exam and note any changes in your body, especially your female organs. Female hormone tests may or may not be helpful depending on the amount of female hormones you produce and when you produce them. However, other hormone tests may be helpful to rule out other problems. How is this treated? In some cases, no treatment is needed. The decision on whether treatment is necessary during the perimenopause should be made by you and your health care provider based on how the symptoms are affecting you and your lifestyle. Various treatments are available, such as:  Treating individual symptoms with a specific medicine for that symptom.  Herbal medicines that can help specific symptoms.  Counseling.  Group therapy.  Follow these instructions at home:  Keep track of your menstrual periods (when they occur, how heavy  they are, how long between periods, and how long they last) as well as your symptoms and when they started.  Only take over-the-counter or prescription medicines as directed by your health care provider.  Sleep and rest.  Exercise.  Eat a diet that contains calcium (good for your bones) and soy (acts like the estrogen hormone).  Do not smoke.  Avoid alcoholic beverages.  Take vitamin supplements as recommended by your health care provider. Taking vitamin E may help in certain cases.  Take calcium and vitamin D supplements to help prevent bone loss.  Group therapy is sometimes helpful.  Acupuncture may help in some cases. Contact a health care provider if:  You have questions about any symptoms you are having.  You need a referral to a specialist (gynecologist, psychiatrist, or psychologist). Get help right away if:  You have vaginal bleeding.  Your period lasts longer than 8 days.  Your periods are recurring sooner than 21 days.  You have bleeding after intercourse.  You have severe depression.  You have pain when you urinate.  You have severe headaches.  You have vision   problems. This information is not intended to replace advice given to you by your health care provider. Make sure you discuss any questions you have with your health care provider. Document Released: 01/17/2005 Document Revised: 05/17/2016 Document Reviewed: 07/09/2013 Elsevier Interactive Patient Education  2017 Elsevier Inc.  

## 2018-02-27 LAB — PROLACTIN: PROLACTIN: 9.3 ng/mL (ref 4.8–23.3)

## 2018-02-27 LAB — FOLLICLE STIMULATING HORMONE: FSH: 20.5 m[IU]/mL

## 2018-02-28 LAB — CYTOLOGY - PAP
Diagnosis: NEGATIVE
HPV: NOT DETECTED

## 2018-03-03 ENCOUNTER — Telehealth: Payer: Self-pay | Admitting: Certified Nurse Midwife

## 2018-03-03 NOTE — Telephone Encounter (Signed)
Spoke with patient. Patient states that she recently found out that her partner has trichomoniasis. Has had intercourse with her partner unprotected since last OV with Leota Sauerseborah Leonard CNM and before partner was treated. Advised will need to return for testing. Patient is agreeable. Appointment scheduled for 03/05/2018 at 11 am scheduled. Patient is agreeable to date and time.  Routing to provider for final review. Patient agreeable to disposition. Will close encounter.

## 2018-03-03 NOTE — Telephone Encounter (Signed)
Patient called with questions for the nurse. She said her partner has been recently diagnosed with trichomoniasis. The patient was seen on 02/26/18 and she wants to know if she should be rechecked or if it would have showed up in the testing she had done that day.

## 2018-03-04 ENCOUNTER — Telehealth: Payer: Self-pay | Admitting: Certified Nurse Midwife

## 2018-03-04 NOTE — Telephone Encounter (Signed)
Patient called stating she was seen last Wednesday by Lavella Lemonseborah Leoanrd, CNM. Patient wanted to convey to provider, she had her last mammogram on 04/25/17 with [facility] in [place], [place]. Patient states she will call in April to schedule her mammogram in May.   Will close encounter and forward to Leota Sauerseborah Leonard, CNM for final review.

## 2018-03-05 ENCOUNTER — Ambulatory Visit: Payer: 59 | Admitting: Certified Nurse Midwife

## 2018-03-05 ENCOUNTER — Other Ambulatory Visit: Payer: Self-pay

## 2018-03-05 ENCOUNTER — Encounter: Payer: Self-pay | Admitting: Certified Nurse Midwife

## 2018-03-05 VITALS — BP 110/68 | HR 64 | Resp 16 | Ht 64.25 in | Wt 200.0 lb

## 2018-03-05 DIAGNOSIS — Z113 Encounter for screening for infections with a predominantly sexual mode of transmission: Secondary | ICD-10-CM

## 2018-03-05 DIAGNOSIS — Z01419 Encounter for gynecological examination (general) (routine) without abnormal findings: Secondary | ICD-10-CM

## 2018-03-05 NOTE — Progress Notes (Signed)
52 y.o. Single African American female [phone] here for STD screening. Was told by partner of 2 years that he was informed after being with someone else she had trichomonas and now wants to make sure no other STD concerns. Partner no symptoms. Denies any vaginal symptoms of itching, burning, and increase discharge. Discussed with partner his need to screen and he agreed to go today. She plans of seeing his screen when done. Contraception tubal ligation.  ROS Pertinent to HPI  O:Healthy female WDWN Affect: normal, orientation x 3  Exam: Skin: warm and dry Abdomen: Soft non tender,no masses  inguinal Lymph nodes: no enlargement or tenderness Pelvic exam: External genital: normal female BUS: negative Vagina: normal white discharge noted   , Affirm taken Cervix: normal, non tender, no CMT Uterus: normal, non tender Adnexa:normal, non tender, no masses or fullness noted   A:Normal pelvic exam STD screening ? exposure   P:Discussed findings of normal pelvic exam and wise to screen for STD's. Will be notified of results when in. Encouraged to have partner screened also. Questions addressed on STD prevention. Lab: STD panel, Hep C, Affirm, Gc/Chlamydia   Rv prn

## 2018-03-06 LAB — VAGINITIS/VAGINOSIS, DNA PROBE
Candida Species: NEGATIVE
GARDNERELLA VAGINALIS: POSITIVE — AB
Trichomonas vaginosis: NEGATIVE

## 2018-03-06 LAB — HEP, RPR, HIV PANEL
HIV SCREEN 4TH GENERATION: NONREACTIVE
Hepatitis B Surface Ag: NEGATIVE
RPR: NONREACTIVE

## 2018-03-06 LAB — HEPATITIS C ANTIBODY

## 2018-03-06 LAB — GC/CHLAMYDIA PROBE AMP
Chlamydia trachomatis, NAA: NEGATIVE
Neisseria gonorrhoeae by PCR: NEGATIVE

## 2018-03-07 ENCOUNTER — Other Ambulatory Visit: Payer: Self-pay

## 2018-03-07 MED ORDER — METRONIDAZOLE 500 MG PO TABS: 500.0000 mg | ORAL_TABLET | Freq: Two times a day (BID) | ORAL | 0 refills | Status: DC

## 2018-03-07 NOTE — Telephone Encounter (Signed)
lmtcb

## 2018-03-07 NOTE — Telephone Encounter (Signed)
Pt.notified

## 2018-03-27 ENCOUNTER — Encounter: Payer: 59 | Attending: Family Medicine | Admitting: Skilled Nursing Facility1

## 2018-03-27 ENCOUNTER — Encounter: Payer: Self-pay | Admitting: Skilled Nursing Facility1

## 2018-03-27 DIAGNOSIS — Z6831 Body mass index (BMI) 31.0-31.9, adult: Secondary | ICD-10-CM | POA: Diagnosis not present

## 2018-03-27 DIAGNOSIS — E1165 Type 2 diabetes mellitus with hyperglycemia: Secondary | ICD-10-CM | POA: Insufficient documentation

## 2018-03-27 DIAGNOSIS — Z713 Dietary counseling and surveillance: Secondary | ICD-10-CM | POA: Diagnosis present

## 2018-03-27 DIAGNOSIS — E119 Type 2 diabetes mellitus without complications: Secondary | ICD-10-CM

## 2018-03-27 NOTE — Progress Notes (Signed)
Pt stats she has been fasting with her church and thinks she wants to continue it. Pt states she works at night.  Pts A1C 9.9 stating she was in denial so she was not taking her medicine. Pt states her doctor told her she did not need to check her blood sugar. Pt states she is having blurry vision and needing her glasses more often.  Pt states sometimes at about 5-6am feeling weak, jittery, nauseoous.  Pt states she gets off at 9:30 in the morning, stating she does not get enough rest.  Diabetes Self-Management Education  Visit Type: First/Initial 03/27/2018  Cassandra Morgan, identified by name and date of birth, is a 52 y.o. female with a diagnosis of Diabetes: Type 2.   ASSESSMENT  Height 5\' 5"  (1.651 m), weight 189 lb (85.7 kg). Body mass index is 31.45 kg/m.  Diabetes Self-Management Education - 03/27/18 0939      Visit Information   Visit Type  First/Initial      Initial Visit   Diabetes Type  Type 2    Are you currently following a meal plan?  No    Are you taking your medications as prescribed?  Yes    Date Diagnosed  3 years ago      Health Coping   How would you rate your overall health?  Good      Psychosocial Assessment   Patient Belief/Attitude about Diabetes  Denial      Pre-Education Assessment   Patient understands the diabetes disease and treatment process.  Needs Instruction    Patient understands incorporating nutritional management into lifestyle.  Needs Instruction    Patient undertands incorporating physical activity into lifestyle.  Needs Instruction    Patient understands using medications safely.  Needs Instruction    Patient understands monitoring blood glucose, interpreting and using results  Needs Instruction    Patient understands prevention, detection, and treatment of acute complications.  Needs Instruction    Patient understands prevention, detection, and treatment of chronic complications.  Needs Instruction    Patient understands how to  develop strategies to address psychosocial issues.  Needs Instruction    Patient understands how to develop strategies to promote health/change behavior.  Needs Instruction      Complications   Last HgB A1C per patient/outside source  9.9 %    How often do you check your blood sugar?  0 times/day (not testing)    Have you had a dilated eye exam in the past 12 months?  Yes    Have you had a dental exam in the past 12 months?  Yes    Are you checking your feet?  No      Dietary Intake   Breakfast  cheerios or bagel with cream cheese with tomato and cucmbers and peppers    Snack (morning)  peanuts    Lunch  sandwich     Snack (afternoon)  seeds and cranberries or fruit    Dinner  potato or just meat and vegetable    Snack (evening)  cake or pie    Beverage(s)  lemonade, gingerale, cranberry juice, orange juice, water, sparkling juice       Exercise   Exercise Type  Light (walking / raking leaves)    How many days per week to you exercise?  3    How many minutes per day do you exercise?  30    Total minutes per week of exercise  90      Patient  Education   Previous Diabetes Education  No    Disease state   Definition of diabetes, type 1 and 2, and the diagnosis of diabetes;Factors that contribute to the development of diabetes    Nutrition management   Role of diet in the treatment of diabetes and the relationship between the three main macronutrients and blood glucose level;Carbohydrate counting;Food label reading, portion sizes and measuring food.;Reviewed blood glucose goals for pre and post meals and how to evaluate the patients' food intake on their blood glucose level.;Information on hints to eating out and maintain blood glucose control.    Physical activity and exercise   Role of exercise on diabetes management, blood pressure control and cardiac health.;Identified with patient nutritional and/or medication changes necessary with exercise.    Monitoring  Purpose and frequency of  SMBG.;Taught/evaluated SMBG meter.;Yearly dilated eye exam;Daily foot exams    Acute complications  Taught treatment of hypoglycemia - the 15 rule.;Discussed and identified patients' treatment of hyperglycemia.    Chronic complications  Dental care;Assessed and discussed foot care and prevention of foot problems;Retinopathy and reason for yearly dilated eye exams    Psychosocial adjustment  Worked with patient to identify barriers to care and solutions;Role of stress on diabetes      Individualized Goals (developed by patient)   Nutrition  Follow meal plan discussed    Physical Activity  Exercise 5-7 days per week;30 minutes per day    Medications  take my medication as prescribed      Post-Education Assessment   Patient understands the diabetes disease and treatment process.  Demonstrates understanding / competency    Patient understands incorporating nutritional management into lifestyle.  Demonstrates understanding / competency    Patient undertands incorporating physical activity into lifestyle.  Demonstrates understanding / competency    Patient understands using medications safely.  Demonstrates understanding / competency    Patient understands monitoring blood glucose, interpreting and using results  Demonstrates understanding / competency    Patient understands prevention, detection, and treatment of acute complications.  Demonstrates understanding / competency    Patient understands prevention, detection, and treatment of chronic complications.  Demonstrates understanding / competency    Patient understands how to develop strategies to address psychosocial issues.  Demonstrates understanding / competency    Patient understands how to develop strategies to promote health/change behavior.  Demonstrates understanding / competency      Outcomes   Expected Outcomes  Demonstrated interest in learning. Expect positive outcomes    Future DMSE  PRN    Program Status  Completed        Individualized Plan for Diabetes Self-Management Training:   Learning Objective:  Patient will have a greater understanding of diabetes self-management. Patient education plan is to attend individual and/or group sessions per assessed needs and concerns.   Plan:   Patient Instructions  -Always bring your meter with you everywhere you go -Always Properly dispose of your needles:  -Discard in a hard plastic/metal container with a lid (something the needle can't puncture)  -Write Do Not Recycle on the outside of the container  -Example: A laundry detergent bottle -Never use the same needle more than once -Eat 2-3 carbohydrate choices for each meal and 1 for each snack -A meal: carbohydrates, protein, vegetable -A snack: A Fruit OR Vegetable AND Protein  -Try to be more active -Always pay attention to your body keeping watchful of possible low blood sugar (below 70) or high blood sugar (above 200)  -Check your feet every  day looking for anything that was not there the day before  -Aim to get continuous 6 hours of sleep every night  -Ask insurance what glucose monitor is covered then get the script from your doctor   -Check your blood sugar 1-2 times a day: before you eat anything and 2 hours after a meal   Expected Outcomes:  Demonstrated interest in learning. Expect positive outcomes  Education material provided: Living Well with Diabetes, My Plate, Snack sheet and Support group flyer  If problems or questions, patient to contact team via:  Phone  Future DSME appointment: PRN

## 2018-03-27 NOTE — Patient Instructions (Addendum)
-  Always bring your meter with you everywhere you go -Always Properly dispose of your needles:  -Discard in a hard plastic/metal container with a lid (something the needle can't puncture)  -Write Do Not Recycle on the outside of the container  -Example: A laundry detergent bottle -Never use the same needle more than once -Eat 2-3 carbohydrate choices for each meal and 1 for each snack -A meal: carbohydrates, protein, vegetable -A snack: A Fruit OR Vegetable AND Protein  -Try to be more active -Always pay attention to your body keeping watchful of possible low blood sugar (below 70) or high blood sugar (above 200)  -Check your feet every day looking for anything that was not there the day before  -Aim to get continuous 6 hours of sleep every night  -Ask insurance what glucose monitor is covered then get the script from your doctor   -Check your blood sugar 1-2 times a day: before you eat anything and 2 hours after a meal

## 2018-06-16 ENCOUNTER — Other Ambulatory Visit: Payer: Self-pay

## 2018-06-16 ENCOUNTER — Ambulatory Visit: Payer: 59 | Admitting: Obstetrics and Gynecology

## 2018-06-16 ENCOUNTER — Encounter: Payer: Self-pay | Admitting: Obstetrics and Gynecology

## 2018-06-16 ENCOUNTER — Telehealth: Payer: Self-pay | Admitting: Obstetrics and Gynecology

## 2018-06-16 VITALS — BP 120/62 | HR 76 | Resp 14 | Ht 64.25 in | Wt 197.4 lb

## 2018-06-16 DIAGNOSIS — R350 Frequency of micturition: Secondary | ICD-10-CM | POA: Diagnosis not present

## 2018-06-16 DIAGNOSIS — R8290 Unspecified abnormal findings in urine: Secondary | ICD-10-CM

## 2018-06-16 LAB — POCT URINALYSIS DIPSTICK
Bilirubin, UA: NEGATIVE
Glucose, UA: POSITIVE — AB
KETONES UA: NEGATIVE
NITRITE UA: NEGATIVE
Protein, UA: POSITIVE — AB
UROBILINOGEN UA: 0.2 U/dL
pH, UA: 7 (ref 5.0–8.0)

## 2018-06-16 MED ORDER — PHENAZOPYRIDINE HCL 200 MG PO TABS: 200.0000 mg | ORAL_TABLET | Freq: Three times a day (TID) | ORAL | 0 refills | Status: DC | PRN

## 2018-06-16 MED ORDER — SULFAMETHOXAZOLE-TRIMETHOPRIM 800-160 MG PO TABS: 1.0000 | ORAL_TABLET | Freq: Two times a day (BID) | ORAL | 0 refills | Status: DC

## 2018-06-16 MED ORDER — FLUCONAZOLE 150 MG PO TABS: 150.0000 mg | ORAL_TABLET | Freq: Once | ORAL | 0 refills | Status: AC

## 2018-06-16 NOTE — Telephone Encounter (Signed)
Patient was seen today and she received an alert from her pharmacy that her prescription will not be covered. Patient is asking for an alternative.

## 2018-06-16 NOTE — Patient Instructions (Signed)

## 2018-06-16 NOTE — Progress Notes (Signed)
GYNECOLOGY  VISIT   HPI: 52 y.o.   Single  African American  female   [phone] with Patient's last menstrual period was 05/19/2018.   here for UTI symptoms.  Symptoms started yesterday, increasing today.  Reporting small voids and painful urination.  No hematuria.  No fever, shakes, chills.  No nausea, vomiting, or back pain.   Last UTI was a long time ago.   Steady partner for 2 years.  Had intercourse 3 days ago.   Has diabetes.  HgbA1C 10.1 this month.   Urine dip - Large RBCs, large WBCs, + glucose, + protein.   PCP - Dr. Sigmund HazelLisa Miller.  GYNECOLOGIC HISTORY: Patient's last menstrual period was 05/19/2018. Contraception:  Tubal Menopausal hormone therapy:  N/A Last mammogram: patient states she is due Last pap smear: 02-26-18 Neg:Neg HR HPV, 07-13-15 Neg:Neg HR HPV         OB History    Gravida  4   Para  2   Term  2   Preterm  0   AB  2   Living  2     SAB  1   TAB  1   Ectopic  0   Multiple  0   Live Births  2              Patient Active Problem List   Diagnosis Date Noted  . Chronic rhinitis 02/26/2018  . Gastro-esophageal reflux disease without esophagitis 02/26/2018  . Anxiety 12/02/2015  . Moderate episode of recurrent major depressive disorder (HCC) 12/02/2015  . Dyslipidemia associated with type 2 diabetes mellitus (HCC) 12/06/2014    Past Medical History:  Diagnosis Date  . Diabetes (HCC)    diagnosed as prediabetic 2013  . Hypertension     Past Surgical History:  Procedure Laterality Date  . INGUINAL HERNIA REPAIR Right 1987  . TUBAL LIGATION      Current Outpatient Medications  Medication Sig Dispense Refill  . albuterol (PROVENTIL HFA;VENTOLIN HFA) 108 (90 Base) MCG/ACT inhaler     . atorvastatin (LIPITOR) 10 MG tablet Take 10 mg by mouth daily.  0  . CINNAMON PO Take by mouth.    Marland Kitchen. JANUVIA 100 MG tablet Take 100 mg by mouth daily.  3  . lisinopril (PRINIVIL,ZESTRIL) 2.5 MG tablet Take 2.5 mg by mouth daily.    .  metFORMIN (GLUCOPHAGE) 1000 MG tablet Take 1,000 mg by mouth 2 (two) times daily.     . vitamin E (VITAMIN E) 400 UNIT capsule Take by mouth.     No current facility-administered medications for this visit.      ALLERGIES: Metformin and related  Family History  Problem Relation Age of Onset  . Diabetes Mother   . Diabetes Father   . Diabetes Sister   . Diabetes Sister   . Diabetes Other     Social History   Socioeconomic History  . Marital status: Single    Spouse name: Not on file  . Number of children: 2  . Years of education: Not on file  . Highest education level: Not on file  Occupational History  . Not on file  Social Needs  . Financial resource strain: Not on file  . Food insecurity:    Worry: Not on file    Inability: Not on file  . Transportation needs:    Medical: Not on file    Non-medical: Not on file  Tobacco Use  . Smoking status: Never Smoker  . Smokeless tobacco: Never  Used  Substance and Sexual Activity  . Alcohol use: No    Frequency: Never  . Drug use: No  . Sexual activity: Yes    Partners: Male    Birth control/protection: Surgical    Comment: BTL  Lifestyle  . Physical activity:    Days per week: Not on file    Minutes per session: Not on file  . Stress: Not on file  Relationships  . Social connections:    Talks on phone: Not on file    Gets together: Not on file    Attends religious service: Not on file    Active member of club or organization: Not on file    Attends meetings of clubs or organizations: Not on file    Relationship status: Not on file  . Intimate partner violence:    Fear of current or ex partner: Not on file    Emotionally abused: Not on file    Physically abused: Not on file    Forced sexual activity: Not on file  Other Topics Concern  . Not on file  Social History Narrative   Now working in [place].    Review of Systems  Constitutional: Negative.   HENT: Negative.   Eyes: Negative.   Respiratory:  Negative.   Cardiovascular: Negative.   Gastrointestinal: Negative.   Endocrine: Negative.   Genitourinary: Positive for dysuria and frequency.  Musculoskeletal: Negative.   Skin: Negative.   Allergic/Immunologic: Negative.   Neurological: Negative.   Hematological: Negative.   Psychiatric/Behavioral: Negative.     PHYSICAL EXAMINATION:    BP 120/62 (BP Location: Right Arm, Patient Position: Sitting, Cuff Size: Normal)   Pulse 76   Resp 14   Ht 5' 4.25" (1.632 m)   Wt 197 lb 6.4 oz (89.5 kg)   LMP 05/19/2018   BMI 33.62 kg/m     General appearance: alert, cooperative and appears stated age Neck: no adenopathy, supple, symmetrical, trachea midline and thyroid normal to inspection and palpation Lungs: clear to auscultation bilaterally Heart: regular rate and rhythm Abdomen: soft, non-tender, no masses,  no organomegaly Back:  No CVA tenderness. No abnormal inguinal nodes palpated Neurologic: Grossly normal  Pelvic: External genitalia:  no lesions              Urethra:  normal appearing urethra with no masses, tenderness or lesions              Bartholins and Skenes: normal                 Vagina: normal appearing vagina with normal color and discharge, no lesions              Cervix: no lesions                Bimanual Exam:  Uterus:  normal size, contour, position, consistency, mobility, non-tender              Adnexa: no mass, fullness, tenderness              Chaperone was present for exam.  ASSESSMENT  Dysuria.  Abnormal urine dip.  Uncontrolled DM.   PLAN  Urine micro and culture.  Bactrim DS po bid x 3 days.  Pyridium 200 mg po tid prn x 2 days.  We discussed UTIs and risk factors - vaginal atrophy, sexual activity, diabetes.  Return if not improved.   An After Visit Summary was printed and given to the patient.  ___15___ minutes face to  face time of which over 50% was spent in counseling.

## 2018-06-17 LAB — URINE CULTURE

## 2018-06-17 LAB — URINALYSIS, MICROSCOPIC ONLY
BACTERIA UA: NONE SEEN
CASTS: NONE SEEN /LPF
EPITHELIAL CELLS (NON RENAL): NONE SEEN /HPF (ref 0–10)

## 2018-06-20 NOTE — Telephone Encounter (Signed)
Left message to call Noreene LarssonJill at [phone].  Per review of lab results DOS 06/16/18, patient advised to stop bactrim, UC negative.

## 2018-07-03 ENCOUNTER — Other Ambulatory Visit: Payer: Self-pay | Admitting: Family Medicine

## 2018-07-03 DIAGNOSIS — Z1231 Encounter for screening mammogram for malignant neoplasm of breast: Secondary | ICD-10-CM

## 2018-07-14 NOTE — Telephone Encounter (Signed)
Notes recorded by Armen PickupYeakley, Sarah S, RN on 06/18/2018 at 6:14 PM EDT Call to patient. Notified of results as instructed by Dr Edward JollySilva. Advised can discontinue Bactrium. Patient states she is feeling much better. Improved after first dose. Instructed to call back for appointment if symptoms return. ------  Notes recorded by Patton SallesAmundson C Silva, Brook E, MD on 06/18/2018 at 5:04 PM EDT Please inform patient of negative urine culture.  It showed mixed bacterial organisms in a small amount.  She can stop the bactrim DS.  If she is having continued symptoms, she needs to return for a recheck.

## 2018-07-14 NOTE — Telephone Encounter (Signed)
Okay to close encounter?  °

## 2018-07-24 ENCOUNTER — Ambulatory Visit
Admission: RE | Admit: 2018-07-24 | Discharge: 2018-07-24 | Disposition: A | Discharge status: Home / Self Care | Payer: 59 | Source: Ambulatory Visit | Attending: Family Medicine | Admitting: Family Medicine

## 2018-07-24 DIAGNOSIS — Z1231 Encounter for screening mammogram for malignant neoplasm of breast: Secondary | ICD-10-CM

## 2018-09-10 ENCOUNTER — Other Ambulatory Visit: Payer: Self-pay

## 2018-09-10 ENCOUNTER — Encounter: Payer: Self-pay | Admitting: Certified Nurse Midwife

## 2018-09-10 ENCOUNTER — Ambulatory Visit (INDEPENDENT_AMBULATORY_CARE_PROVIDER_SITE_OTHER): Payer: 59 | Admitting: Certified Nurse Midwife

## 2018-09-10 VITALS — BP 118/70 | HR 70 | Resp 16 | Ht 64.25 in | Wt 205.0 lb

## 2018-09-10 DIAGNOSIS — N951 Menopausal and female climacteric states: Secondary | ICD-10-CM | POA: Diagnosis not present

## 2018-09-10 DIAGNOSIS — Z01419 Encounter for gynecological examination (general) (routine) without abnormal findings: Secondary | ICD-10-CM

## 2018-09-10 NOTE — Patient Instructions (Signed)
Perimenopause Perimenopause is the time when your body begins to move into the menopause (no menstrual period for 12 straight months). It is a natural process. Perimenopause can begin 2-8 years before the menopause and usually lasts for 1 year after the menopause. During this time, your ovaries may or may not produce an egg. The ovaries vary in their production of estrogen and progesterone hormones each month. This can cause irregular menstrual periods, difficulty getting pregnant, vaginal bleeding between periods, and uncomfortable symptoms. What are the causes?  Irregular production of the ovarian hormones, estrogen and progesterone, and not ovulating every month. Other causes include:  Tumor of the pituitary gland in the brain.  Medical disease that affects the ovaries.  Radiation treatment.  Chemotherapy.  Unknown causes.  Heavy smoking and excessive alcohol intake can bring on perimenopause sooner. What are the signs or symptoms?  Hot flashes.  Night sweats.  Irregular menstrual periods.  Decreased sex drive.  Vaginal dryness.  Headaches.  Mood swings.  Depression.  Memory problems.  Irritability.  Tiredness.  Weight gain.  Trouble getting pregnant.  The beginning of losing bone cells (osteoporosis).  The beginning of hardening of the arteries (atherosclerosis). How is this diagnosed? Your health care provider will make a diagnosis by analyzing your age, menstrual history, and symptoms. He or she will do a physical exam and note any changes in your body, especially your female organs. Female hormone tests may or may not be helpful depending on the amount of female hormones you produce and when you produce them. However, other hormone tests may be helpful to rule out other problems. How is this treated? In some cases, no treatment is needed. The decision on whether treatment is necessary during the perimenopause should be made by you and your health care  provider based on how the symptoms are affecting you and your lifestyle. Various treatments are available, such as:  Treating individual symptoms with a specific medicine for that symptom.  Herbal medicines that can help specific symptoms.  Counseling.  Group therapy. Follow these instructions at home:  Keep track of your menstrual periods (when they occur, how heavy they are, how long between periods, and how long they last) as well as your symptoms and when they started.  Only take over-the-counter or prescription medicines as directed by your health care provider.  Sleep and rest.  Exercise.  Eat a diet that contains calcium (good for your bones) and soy (acts like the estrogen hormone).  Do not smoke.  Avoid alcoholic beverages.  Take vitamin supplements as recommended by your health care provider. Taking vitamin E may help in certain cases.  Take calcium and vitamin D supplements to help prevent bone loss.  Group therapy is sometimes helpful.  Acupuncture may help in some cases. Contact a health care provider if:  You have questions about any symptoms you are having.  You need a referral to a specialist (gynecologist, psychiatrist, or psychologist). Get help right away if:  You have vaginal bleeding.  Your period lasts longer than 8 days.  Your periods are recurring sooner than 21 days.  You have bleeding after intercourse.  You have severe depression.  You have pain when you urinate.  You have severe headaches.  You have vision problems. This information is not intended to replace advice given to you by your health care provider. Make sure you discuss any questions you have with your health care provider. Document Released: 01/17/2005 Document Revised: 05/17/2016 Document Reviewed: 07/09/2013 Elsevier Interactive   Patient Education  2017 [employer]  

## 2018-09-10 NOTE — Progress Notes (Signed)
  Subjective:     Patient ID: Cassandra Morgan, female   DOB: 07/10/1966, 52 y.o.   MRN: [id number]  Patient here for amenorrhea x 3 months. She has noted some brown discharge starting last pm and continues today. No cramping but dull headache as usual for her prior to onset. Stops once starts. Occasional hot flashes and night sweats. Has gained weight 8 pounds since last visit. Working on exercise and diet now. Patient was seen in 3/19 for amenorrhea and had FSH,TSH,prolactin done,all essentially normal. No changes other than weight since then.    Review of Systems  Constitutional: Negative.   HENT: Negative.   Eyes: Negative.   Respiratory: Negative.   Cardiovascular: Negative.   Gastrointestinal: Negative.   Genitourinary: Positive for menstrual problem and vaginal bleeding. Negative for pelvic pain, vaginal discharge and vaginal pain.       No period in past 3 months, but started last pm  Musculoskeletal: Negative.   Skin: Negative.   Neurological: Positive for headaches.       Prior to period onset, resolves when starts  Hematological: Does not bruise/bleed easily.  Psychiatric/Behavioral: Negative.        Objective:   Physical Exam  Constitutional: She is oriented to person, place, and time. She appears well-developed and well-nourished.  Abdominal: Soft. She exhibits no mass. There is no tenderness.  Genitourinary: Uterus normal. Pelvic exam was performed with patient supine. There is no rash, tenderness, lesion or injury on the right labia. There is no rash, tenderness, lesion or injury on the left labia. Cervix exhibits no motion tenderness, no discharge and no friability. Right adnexum displays no tenderness and no fullness. Left adnexum displays no tenderness and no fullness. There is bleeding in the vagina.  Genitourinary Comments: Small amount of red blood noted in vagina and from cervix, appears to be menses  Lymphadenopathy: No inguinal adenopathy noted on the right or  left side.  Neurological: She is alert and oriented to person, place, and time.  Skin: Skin is warm and dry.  Psychiatric: She has a normal mood and affect. Her behavior is normal. Judgment and thought content normal.       Assessment:     Amenorrhea started period last pm Normal pelvic exam Perimenopausal with cycle change    Plan:     Discussed finding of normal pelvic exam and bleeding appears to menses. Reassured. Discussed perimenopausal changes and menopausal once one year with no period. Expectations with bleeding reviewed. Questions addressed. Patient will continue to keep calendar of menses and advise if no menses in 3 months or questions.  Rv prn

## 2018-09-26 DIAGNOSIS — E785 Hyperlipidemia, unspecified: Secondary | ICD-10-CM | POA: Insufficient documentation

## 2018-09-26 DIAGNOSIS — E669 Obesity, unspecified: Secondary | ICD-10-CM | POA: Insufficient documentation

## 2018-11-07 ENCOUNTER — Ambulatory Visit (INDEPENDENT_AMBULATORY_CARE_PROVIDER_SITE_OTHER): Payer: 59

## 2018-11-07 ENCOUNTER — Encounter: Payer: Self-pay | Admitting: Podiatry

## 2018-11-07 ENCOUNTER — Ambulatory Visit: Payer: 59 | Admitting: Podiatry

## 2018-11-07 ENCOUNTER — Other Ambulatory Visit: Payer: Self-pay

## 2018-11-07 VITALS — BP 93/60 | HR 76 | Resp 16 | Ht 65.0 in | Wt 202.0 lb

## 2018-11-07 DIAGNOSIS — M10071 Idiopathic gout, right ankle and foot: Secondary | ICD-10-CM | POA: Diagnosis not present

## 2018-11-07 DIAGNOSIS — M779 Enthesopathy, unspecified: Secondary | ICD-10-CM | POA: Diagnosis not present

## 2018-11-07 DIAGNOSIS — M778 Other enthesopathies, not elsewhere classified: Secondary | ICD-10-CM

## 2018-11-07 NOTE — Progress Notes (Signed)
   Subjective:    Patient ID: Cassandra Morgan, female    DOB: 01-21-1966, 52 y.o.   MRN: [id number]  HPI 52 year old female presents the office today for concerns of pain to the right big toe joint with pain 8/10 and become more consistent.  She does note some swelling to the right foot as well as some redness on the bunion area.  She denies any recent injury or trauma.  She denies any open sores.  She had no drainage.  She said no recent treatment.   Review of Systems  Musculoskeletal: Positive for arthralgias, joint swelling and myalgias.  All other systems reviewed and are negative.  Past Medical History:  Diagnosis Date  . Diabetes (HCC)    diagnosed as prediabetic 2013  . Hypertension     Past Surgical History:  Procedure Laterality Date  . INGUINAL HERNIA REPAIR Right 1987  . TUBAL LIGATION       Current Outpatient Medications:  .  atorvastatin (LIPITOR) 10 MG tablet, Take 10 mg by mouth daily., Disp: , Rfl: 0 .  CINNAMON PO, Take by mouth., Disp: , Rfl:  .  empagliflozin (JARDIANCE) 10 MG TABS tablet, Take by mouth., Disp: , Rfl:  .  lisinopril (PRINIVIL,ZESTRIL) 2.5 MG tablet, Take 2.5 mg by mouth daily., Disp: , Rfl:  .  metFORMIN (GLUCOPHAGE) 1000 MG tablet, Take 1,000 mg by mouth 2 (two) times daily. , Disp: , Rfl:  .  indomethacin (INDOCIN) 50 MG capsule, Take 1 capsule (50 mg total) by mouth 2 (two) times daily with a meal., Disp: 20 capsule, Rfl: 0  No Active Allergies       Objective:   Physical Exam General: AAO x3, NAD  Dermatological: There is mild edema to the right foot compared to contralateral extremity there is mild erythema directly along the first metatarsal head medially and there is tenderness of the first MPJ.  There is no open lesions identified there is no increase in warmth of the foot there is no ascending cellulitis.  There is no fluctuation or crepitation or any malodor.  Vascular: Dorsalis Pedis artery and Posterior Tibial artery pedal  pulses are 2/4 bilateral with immedate capillary fill time.  There is no pain with calf compression, swelling, warmth, erythema.   Neruologic: Grossly intact via light touch bilateral.  Protective threshold with Semmes Wienstein monofilament intact to all pedal sites bilateral.   Musculoskeletal: Tenderness of the first MPJ right foot no other areas of pinpoint tenderness.  Muscular strength 5/5 in all groups tested bilateral.  Gait: Unassisted, Nonantalgic.     Assessment & Plan:  Right foot first MPJ capsulitis, likely gout -Treatment options discussed including all alternatives, risks, and complications -Etiology of symptoms were discussed -X-rays were obtained and reviewed with the patient.  No definitive evidence of acute fracture or stress fracture. -We will recheck a uric acid level.  Evaluation is been prescribed colchicine for her but she did not get this.  She called on Saturday after hours I reviewed the uric acid level with her.  I prescribed indomethacin. -Discussed wearing a stiffer soled shoe.  Vivi BarrackMatthew R Izek Corvino DPM

## 2018-11-08 LAB — URIC ACID: Uric Acid, Serum: 3.9 mg/dL (ref 2.5–7.0)

## 2018-11-08 MED ORDER — INDOMETHACIN 50 MG PO CAPS: 50.0000 mg | ORAL_CAPSULE | Freq: Two times a day (BID) | ORAL | 0 refills | Status: DC

## 2018-11-11 ENCOUNTER — Telehealth: Payer: Self-pay | Admitting: *Deleted

## 2018-11-11 NOTE — Telephone Encounter (Signed)
Called patient and stated that Dr Ardelle AntonWagoner wanted me to call and check in on you after your visit on Friday 11/07/2018 and patient stated that she was in no pain and there was no swelling and was taken the medicine prescribed and was doing much better and I stated to call the office if any concerns or questions. Misty StanleyLisa

## 2019-03-19 ENCOUNTER — Ambulatory Visit: Payer: 59 | Admitting: Certified Nurse Midwife

## 2019-05-28 ENCOUNTER — Other Ambulatory Visit: Payer: Self-pay

## 2019-06-01 ENCOUNTER — Encounter: Payer: Self-pay | Admitting: Certified Nurse Midwife

## 2019-06-01 ENCOUNTER — Ambulatory Visit: Payer: 59 | Admitting: Certified Nurse Midwife

## 2019-06-01 NOTE — Progress Notes (Deleted)
53 y.o. [id number] Single  African American Fe here for annual exam.    No LMP recorded.          Sexually active: {yes no:314532}  The current method of family planning is tubal ligation.    Exercising: {yes no:314532}  {types:19826} Smoker:  {YES NO:22349}  ROS  Health Maintenance: Pap:  07-13-15 neg HPV HR neg, 02-26-18 neg HPV HR neg History of Abnormal Pap: no MMG:  07-24-2018 category b density birads 1:neg Self Breast exams: yes Colonoscopy:  none BMD:   none TDaP:  2012 Shingles: not done Pneumonia: 2013  Hep C and HIV: done with pregnancy Labs: ***   reports that she has never smoked. She has never used smokeless tobacco. She reports that she does not drink alcohol or use drugs.  Past Medical History:  Diagnosis Date  . Diabetes ([place])    diagnosed as prediabetic 2013  . Hypertension     Past Surgical History:  Procedure Laterality Date  . INGUINAL HERNIA REPAIR Right 1987  . TUBAL LIGATION      Current Outpatient Medications  Medication Sig Dispense Refill  . atorvastatin (LIPITOR) 10 MG tablet Take 10 mg by mouth daily.  0  . CINNAMON PO Take by mouth.    . empagliflozin (JARDIANCE) 10 MG TABS tablet Take by mouth.    . indomethacin (INDOCIN) 50 MG capsule Take 1 capsule (50 mg total) by mouth 2 (two) times daily with a meal. 20 capsule 0  . lisinopril (PRINIVIL,ZESTRIL) 2.5 MG tablet Take 2.5 mg by mouth daily.    . metFORMIN (GLUCOPHAGE) 1000 MG tablet Take 1,000 mg by mouth 2 (two) times daily.      No current facility-administered medications for this visit.     Family History  Problem Relation Age of Onset  . Diabetes Mother   . Diabetes Father   . Diabetes Sister   . Diabetes Sister   . Diabetes Other     ROS:  Pertinent items are noted in HPI.  Otherwise, a comprehensive ROS was negative.  Exam:   There were no vitals taken for this visit.   Ht Readings from Last 3 Encounters:  11/07/18 5\' 5"  (1.651 m)  09/10/18 5' 4.25" (1.632 m)  06/16/18  5' 4.25" (1.632 m)    General appearance: alert, cooperative and appears stated age Head: Normocephalic, without obvious abnormality, atraumatic Neck: no adenopathy, supple, symmetrical, trachea midline and thyroid {EXAM; THYROID:18604} Lungs: clear to auscultation bilaterally Breasts: {Exam; breast:13139::"normal appearance, no masses or tenderness"} Heart: regular rate and rhythm Abdomen: soft, non-tender; no masses,  no organomegaly Extremities: extremities normal, atraumatic, no cyanosis or edema Skin: Skin color, texture, turgor normal. No rashes or lesions Lymph nodes: Cervical, supraclavicular, and axillary nodes normal. No abnormal inguinal nodes palpated Neurologic: Grossly normal   Pelvic: External genitalia:  no lesions              Urethra:  normal appearing urethra with no masses, tenderness or lesions              Bartholin's and Skene's: normal                 Vagina: normal appearing vagina with normal color and discharge, no lesions              Cervix: {exam; cervix:14595}              Pap taken: {yes no:314532} Bimanual Exam:  Uterus:  {exam; uterus:12215}  Adnexa: {exam; adnexa:12223}               Rectovaginal: Confirms               Anus:  normal sphincter tone, no lesions  Chaperone present: ***  A:  Well Woman with normal exam  P:   Reviewed health and wellness pertinent to exam  Pap smear: {YES NO:22349}  {plan; gyn:5269::"mammogram","pap smear","return annually or prn"}  An After Visit Summary was printed and given to the patient.

## 2019-06-22 ENCOUNTER — Ambulatory Visit: Payer: 59 | Admitting: Certified Nurse Midwife

## 2019-06-22 ENCOUNTER — Other Ambulatory Visit: Payer: Self-pay

## 2019-06-22 ENCOUNTER — Encounter: Payer: Self-pay | Admitting: Certified Nurse Midwife

## 2019-06-22 VITALS — BP 120/70 | HR 68 | Temp 97.4°F | Resp 16 | Ht 64.75 in | Wt 199.0 lb

## 2019-06-22 DIAGNOSIS — N951 Menopausal and female climacteric states: Secondary | ICD-10-CM | POA: Diagnosis not present

## 2019-06-22 DIAGNOSIS — N912 Amenorrhea, unspecified: Secondary | ICD-10-CM | POA: Diagnosis not present

## 2019-06-22 DIAGNOSIS — Z01419 Encounter for gynecological examination (general) (routine) without abnormal findings: Secondary | ICD-10-CM | POA: Diagnosis not present

## 2019-06-22 NOTE — Progress Notes (Signed)
53 y.o. [id number] Single  African American Fe here for annual exam. Periods none since 05/20/18. No spotting or bleeding. Have some hot flashes, no night sweats.  No vaginal dryness. Sexually active no partner change, no STD screening needed. Contraception tubal ligation.[facility] PCP for cholesterol hypertension, diabetes 2 management/labs, all stable and normal per patient.Marland Kitchen "Feeling blessed she has been healthy". Staying busy with work. Nephew injured at work today, so concerned. No other health issues .   LMP 05/20/18. Sexually active: Yes.    The current method of family planning is tubal ligation.    Exercising: Yes.    bike, walking Smoker:  no  Review of Systems  Constitutional: Negative.   HENT: Negative.   Eyes: Negative.   Respiratory: Negative.   Cardiovascular: Negative.   Gastrointestinal: Negative.   Genitourinary:       Amenorrhea  Musculoskeletal: Negative.   Skin: Negative.   Neurological: Negative.   Endo/Heme/Allergies: Negative.   Psychiatric/Behavioral: Negative.     Health Maintenance: Pap:  02-26-18 neg HPV HR neg History of Abnormal Pap: no MMG:  07-24-18 category b density birads 1:neg Self Breast exams: yes Colonoscopy: Cologard from PCP BMD:   none TDaP:  2012 Shingles: no Pneumonia: no Hep C and HIV: both neg 2019 Labs: only if needed   reports that she has never smoked. She has never used smokeless tobacco. She reports that she does not drink alcohol or use drugs.  Past Medical History:  Diagnosis Date  . Diabetes ([place])    diagnosed as prediabetic 2013  . Hypertension     Past Surgical History:  Procedure Laterality Date  . INGUINAL HERNIA REPAIR Right 1987  . TUBAL LIGATION      Current Outpatient Medications  Medication Sig Dispense Refill  . atorvastatin (LIPITOR) 10 MG tablet Take 10 mg by mouth daily.  0  . CINNAMON PO Take by mouth.    . indomethacin (INDOCIN) 50 MG capsule Take 1 capsule (50 mg total) by mouth 2 (two) times daily with  a meal. 20 capsule 0  . lisinopril (PRINIVIL,ZESTRIL) 2.5 MG tablet Take 2.5 mg by mouth daily.    . metFORMIN (GLUCOPHAGE) 1000 MG tablet Take 1,000 mg by mouth 2 (two) times daily.      No current facility-administered medications for this visit.     Family History  Problem Relation Age of Onset  . Diabetes Mother   . Diabetes Father   . Diabetes Sister   . Diabetes Sister   . Diabetes Other     ROS:  Pertinent items are noted in HPI.  Otherwise, a comprehensive ROS was negative.  Exam:   BP 120/70   Pulse 68   Temp (!) 97.4 F (36.3 C) (Skin)   Resp 16   Ht 5' 4.75" (1.645 m)   Wt 199 lb (90.3 kg)   LMP 05/21/2019 (Exact Date)   BMI 33.37 kg/m  Height: 5' 4.75" (164.5 cm) Ht Readings from Last 3 Encounters:  06/22/19 5' 4.75" (1.645 m)  11/07/18 5\' 5"  (1.651 m)  09/10/18 5' 4.25" (1.632 m)    General appearance: alert, cooperative and appears stated age Head: Normocephalic, without obvious abnormality, atraumatic Neck: no adenopathy, supple, symmetrical, trachea midline and thyroid normal to inspection and palpation Lungs: clear to auscultation bilaterally Breasts: normal appearance, no masses or tenderness, No nipple retraction or dimpling, No nipple discharge or bleeding, No axillary or supraclavicular adenopathy Heart: regular rate and rhythm Abdomen: soft, non-tender; no masses,  no  organomegaly Extremities: extremities normal, atraumatic, no cyanosis or edema Skin: Skin color, texture, turgor normal. No rashes or lesions Lymph nodes: Cervical, supraclavicular, and axillary nodes normal. No abnormal inguinal nodes palpated Neurologic: Grossly normal   Pelvic: External genitalia:  no lesions              Urethra:  normal appearing urethra with no masses, tenderness or lesions              Bartholin's and Skene's: normal                 Vagina: normal appearing vagina with normal color and discharge, no lesions              Cervix: no cervical motion  tenderness, no lesions and normal appearance              Pap taken: No. Bimanual Exam:  Uterus:  normal size, contour, position, consistency, mobility, non-tender and anteverted              Adnexa: normal adnexa and no mass, fullness, tenderness               Rectovaginal: Confirms               Anus:  normal sphincter tone, no lesions  Chaperone present: yes  A:  Well Woman with normal exam  ?Perimenopausal with amenorrhea  Contraception BTL  Hypertension, cholesterol with PCP management  P:   Reviewed health and wellness pertinent to exam.  Discussed perimenopause/menopause with no period in last 9 months suspect menopause. Has booklet given last fall. Will do labs to evaluate,, were normal last year. Patient agreeable. Instructed patient to call if vaginal bleeding. Discussed comfort measures for menopause hot flashes. Questions addressed.  Labs: TSH, Prolactin, FSH  Continue follow up with PCP as indicated  Pap smear: no   counseled on breast self exam, mammography screening, use and side effects of OCP's, menopause, adequate intake of calcium and vitamin D, diet and exercise  return annually or prn  An After Visit Summary was printed and given to the patient.

## 2019-06-23 LAB — FOLLICLE STIMULATING HORMONE: FSH: 74.9 m[IU]/mL

## 2019-06-23 LAB — PROLACTIN: Prolactin: 11.6 ng/mL (ref 4.8–23.3)

## 2019-06-23 LAB — TSH: TSH: 2.04 u[IU]/mL (ref 0.450–4.500)

## 2019-06-25 ENCOUNTER — Other Ambulatory Visit: Payer: Self-pay | Admitting: Family Medicine

## 2019-06-25 DIAGNOSIS — Z1231 Encounter for screening mammogram for malignant neoplasm of breast: Secondary | ICD-10-CM

## 2019-08-11 ENCOUNTER — Ambulatory Visit
Admission: RE | Admit: 2019-08-11 | Discharge: 2019-08-11 | Disposition: A | Discharge status: Home / Self Care | Payer: 59 | Source: Ambulatory Visit | Attending: Family Medicine | Admitting: Family Medicine

## 2019-08-11 ENCOUNTER — Other Ambulatory Visit: Payer: Self-pay

## 2019-08-11 DIAGNOSIS — Z1231 Encounter for screening mammogram for malignant neoplasm of breast: Secondary | ICD-10-CM

## 2019-08-12 ENCOUNTER — Other Ambulatory Visit: Payer: Self-pay | Admitting: Family Medicine

## 2019-08-12 DIAGNOSIS — R928 Other abnormal and inconclusive findings on diagnostic imaging of breast: Secondary | ICD-10-CM

## 2019-08-14 ENCOUNTER — Other Ambulatory Visit: Payer: Self-pay

## 2019-08-14 ENCOUNTER — Ambulatory Visit
Admission: RE | Admit: 2019-08-14 | Discharge: 2019-08-14 | Disposition: A | Discharge status: Home / Self Care | Payer: 59 | Source: Ambulatory Visit | Attending: Family Medicine | Admitting: Family Medicine

## 2019-08-14 ENCOUNTER — Other Ambulatory Visit: Payer: Self-pay | Admitting: Family Medicine

## 2019-08-14 DIAGNOSIS — R599 Enlarged lymph nodes, unspecified: Secondary | ICD-10-CM

## 2019-08-14 DIAGNOSIS — R928 Other abnormal and inconclusive findings on diagnostic imaging of breast: Secondary | ICD-10-CM

## 2019-08-19 ENCOUNTER — Other Ambulatory Visit: Payer: Self-pay

## 2019-08-19 ENCOUNTER — Ambulatory Visit
Admission: RE | Admit: 2019-08-19 | Discharge: 2019-08-19 | Disposition: A | Discharge status: Home / Self Care | Payer: 59 | Source: Ambulatory Visit | Attending: Family Medicine | Admitting: Family Medicine

## 2019-08-19 DIAGNOSIS — R599 Enlarged lymph nodes, unspecified: Secondary | ICD-10-CM

## 2020-01-07 ENCOUNTER — Other Ambulatory Visit: Payer: Self-pay | Admitting: Family Medicine

## 2020-01-07 DIAGNOSIS — R590 Localized enlarged lymph nodes: Secondary | ICD-10-CM

## 2020-02-23 ENCOUNTER — Ambulatory Visit
Admission: RE | Admit: 2020-02-23 | Discharge: 2020-02-23 | Disposition: A | Discharge status: Home / Self Care | Payer: 59 | Source: Ambulatory Visit | Attending: Family Medicine | Admitting: Family Medicine

## 2020-02-23 ENCOUNTER — Other Ambulatory Visit: Payer: Self-pay | Admitting: Family Medicine

## 2020-02-23 ENCOUNTER — Other Ambulatory Visit: Payer: Self-pay

## 2020-02-23 DIAGNOSIS — R590 Localized enlarged lymph nodes: Secondary | ICD-10-CM

## 2020-03-16 ENCOUNTER — Encounter: Payer: Self-pay | Admitting: Certified Nurse Midwife

## 2020-05-25 ENCOUNTER — Telehealth: Payer: Self-pay | Admitting: Family Medicine

## 2020-05-25 NOTE — Telephone Encounter (Signed)
Attempted to reach patient about calling the office to get scheduled for a new gyn appointment.

## 2020-07-12 ENCOUNTER — Encounter: Payer: Self-pay | Admitting: Student

## 2020-07-12 ENCOUNTER — Other Ambulatory Visit: Payer: Self-pay

## 2020-07-12 ENCOUNTER — Ambulatory Visit (INDEPENDENT_AMBULATORY_CARE_PROVIDER_SITE_OTHER): Payer: 59 | Admitting: Student

## 2020-07-12 VITALS — BP 120/74 | HR 76 | Ht 64.0 in | Wt 198.3 lb

## 2020-07-12 DIAGNOSIS — N898 Other specified noninflammatory disorders of vagina: Secondary | ICD-10-CM

## 2020-07-12 DIAGNOSIS — Z01419 Encounter for gynecological examination (general) (routine) without abnormal findings: Secondary | ICD-10-CM

## 2020-07-12 DIAGNOSIS — R238 Other skin changes: Secondary | ICD-10-CM | POA: Insufficient documentation

## 2020-07-12 MED ORDER — VALACYCLOVIR HCL 1 G PO TABS: ORAL_TABLET | ORAL | 5 refills | Status: AC

## 2020-07-12 NOTE — Progress Notes (Signed)
  History:  [name] is a 54 y.o. [id number] who presents to clinic today for annual exam. She reports the follow on her problem list.  Patient Active Problem List   Diagnosis Date Noted  . Hyperlipidemia 09/26/2018  . Obesity (BMI 30.0-34.9) 09/26/2018  . Chronic rhinitis 02/26/2018  . Gastro-esophageal reflux disease without esophagitis 02/26/2018  . Anxiety 12/02/2015  . Moderate episode of recurrent major depressive disorder (HCC) 12/02/2015  . Dyslipidemia associated with type 2 diabetes mellitus (HCC) 12/06/2014   She has a PCP who manages her chronic conditions. [facility], and has already done a home colonoscopy test.  She was a patient at [facility] but her GYNs retired.   She reports that, three times in the past two years, she has felt some "bumps" near her bottom. They usually get better when she wipes. She says they feel like "fever blisters" when they come on. She has no history of herpes or genital warts.    The following portions of the patient's history were reviewed and updated as appropriate: allergies, current medications, family history, past medical history, social history, past surgical history and problem list.  Review of Systems:  Review of Systems  Constitutional: Negative.   HENT: Negative.   Eyes: Negative.   Cardiovascular: Negative.   Gastrointestinal: Negative.   Genitourinary:       "bumps" down near her bottom  Musculoskeletal: Negative for myalgias.  Skin: Negative.   Neurological: Negative.   Psychiatric/Behavioral: The patient is nervous/anxious and has insomnia.       Objective:  Physical Exam BP 120/74   Pulse 76   Ht 5\' 4"  (1.626 m)   Wt 198 lb 4.8 oz (89.9 kg)   BMI 34.04 kg/m  Physical Exam Constitutional:      Appearance: Normal appearance.  HENT:     Head: Normocephalic.  Cardiovascular:     Rate and Rhythm: Normal rate.     Pulses: Normal  pulses.     Heart sounds: No murmur heard.  No gallop.   Pulmonary:     Effort: Pulmonary effort is normal.  Abdominal:     General: Abdomen is flat. Bowel sounds are normal.  Genitourinary:      Comments: Cluster of blisters on patient's right buttock near anus Musculoskeletal:        General: Normal range of motion.     Cervical back: Neck supple.  Skin:    General: Skin is warm.  Neurological:     Mental Status: She is alert.       Labs and Imaging No results found for this or any previous visit (from the past 24 hour(s)).  No results found.   Assessment & Plan:     1. Vesicular skin lesions   2. Well woman exam    Explained to patient that lesions look like herpes and that we will send sample for confirmation. RX for Valtrex sent.  Continue to follow up with her PCP for diabetes and HTN.  Encouraged patient to get COVID-19 vaccine; patient seems open to getting injection.    Approximately 30 minutes of face-to-face time was spent with this patient   , CNM 07/12/2020 10:49 AM

## 2020-07-14 LAB — HERPES SIMPLEX VIRUS CULTURE

## 2020-07-19 ENCOUNTER — Encounter: Payer: Self-pay | Admitting: Student

## 2020-07-19 DIAGNOSIS — A6009 Herpesviral infection of other urogenital tract: Secondary | ICD-10-CM | POA: Insufficient documentation

## 2020-07-27 ENCOUNTER — Other Ambulatory Visit: Payer: Self-pay | Admitting: Family Medicine

## 2020-07-27 DIAGNOSIS — Z1231 Encounter for screening mammogram for malignant neoplasm of breast: Secondary | ICD-10-CM

## 2020-08-23 ENCOUNTER — Ambulatory Visit
Admission: RE | Admit: 2020-08-23 | Discharge: 2020-08-23 | Disposition: A | Discharge status: Home / Self Care | Payer: 59 | Source: Ambulatory Visit | Attending: Family Medicine | Admitting: Family Medicine

## 2020-08-23 ENCOUNTER — Other Ambulatory Visit: Payer: Self-pay

## 2020-08-23 DIAGNOSIS — Z1231 Encounter for screening mammogram for malignant neoplasm of breast: Secondary | ICD-10-CM

## 2021-09-28 ENCOUNTER — Other Ambulatory Visit: Payer: Self-pay | Admitting: Family Medicine

## 2021-09-28 DIAGNOSIS — Z1231 Encounter for screening mammogram for malignant neoplasm of breast: Secondary | ICD-10-CM

## 2021-10-26 ENCOUNTER — Other Ambulatory Visit: Payer: Self-pay | Admitting: Family Medicine

## 2021-10-26 ENCOUNTER — Ambulatory Visit
Admission: RE | Admit: 2021-10-26 | Discharge: 2021-10-26 | Disposition: A | Discharge status: Home / Self Care | Payer: 59 | Source: Ambulatory Visit | Attending: Family Medicine | Admitting: Family Medicine

## 2021-10-26 DIAGNOSIS — R200 Anesthesia of skin: Secondary | ICD-10-CM

## 2021-11-01 ENCOUNTER — Ambulatory Visit
Admission: RE | Admit: 2021-11-01 | Discharge: 2021-11-01 | Disposition: A | Discharge status: Home / Self Care | Payer: 59 | Source: Ambulatory Visit | Attending: Family Medicine | Admitting: Family Medicine

## 2021-11-01 ENCOUNTER — Ambulatory Visit (INDEPENDENT_AMBULATORY_CARE_PROVIDER_SITE_OTHER): Payer: 59 | Admitting: Podiatry

## 2021-11-01 ENCOUNTER — Other Ambulatory Visit: Payer: Self-pay

## 2021-11-01 DIAGNOSIS — M5415 Radiculopathy, thoracolumbar region: Secondary | ICD-10-CM

## 2021-11-01 DIAGNOSIS — M792 Neuralgia and neuritis, unspecified: Secondary | ICD-10-CM

## 2021-11-01 DIAGNOSIS — Z1231 Encounter for screening mammogram for malignant neoplasm of breast: Secondary | ICD-10-CM

## 2021-11-01 NOTE — Progress Notes (Signed)
  Subjective:  Patient ID: Cassandra Morgan, female    DOB: November 21, 1966,   MRN: [id number]  Chief Complaint  Patient presents with   Foot Pain    Pt states shes having pain from her 4/5 toe  Pt  states its like a burning sensation that has been going on for 2/3 weeks. Pt states she is diabetic.     55 y.o. female presents for numbness in tingling in bilateral toes. States the outside of her foot will get a weird sensation and will move up the side of her legs. Did get and XR from PCP and showed arthritis in her back. Wanted a second opinion that this is truly coming from the back. She is diabetic and last A1c was  6.9 on 05/08/21. Denies any other pedal complaints. Denies n/v/f/c.   PCP Sigmund Hazel MD  Past Medical History:  Diagnosis Date   Diabetes [facility])    diagnosed as prediabetic 2013   Hypertension     Objective:  Physical Exam: Vascular: DP/PT pulses 2/4 bilateral. CFT <3 seconds. Normal hair growth on digits. No edema.  Skin. No lacerations or abrasions bilateral feet.  Musculoskeletal: MMT 5/5 bilateral lower extremities in DF, PF, Inversion and Eversion. Deceased ROM in DF of ankle joint. No tenderness to palpation. Negative tinel and valleix.  Neurological: Sensation intact to light touch.   Assessment:   1. Neuritis   2. Radiculopathy of thoracolumbar region      Plan:  Patient was evaluated and treated and all questions answered. Discussed neuritis and radiculopathy. and etiology as well as treatment with patient.  Radiographs reviewed and discussed with patient.  -Discussed this pain is likely coming from the back. Suggest follow-up/further work-up for back. -Follow-up with PCP for prescription management of gabapentin if any worsening of pain.  -Patient to return as needed.    Louann Sjogren, DPM

## 2022-10-02 ENCOUNTER — Other Ambulatory Visit: Payer: Self-pay | Admitting: Family Medicine

## 2022-10-02 DIAGNOSIS — Z1231 Encounter for screening mammogram for malignant neoplasm of breast: Secondary | ICD-10-CM

## 2022-11-06 ENCOUNTER — Ambulatory Visit: Payer: 59

## 2022-11-15 ENCOUNTER — Emergency Department (HOSPITAL_COMMUNITY): Payer: 59

## 2022-11-15 ENCOUNTER — Emergency Department (HOSPITAL_COMMUNITY)
Admission: EM | Admit: 2022-11-15 | Discharge: 2022-11-15 | Disposition: A | Discharge status: Home / Self Care | Payer: 59 | Attending: Emergency Medicine | Admitting: Emergency Medicine

## 2022-11-15 ENCOUNTER — Other Ambulatory Visit: Payer: Self-pay

## 2022-11-15 ENCOUNTER — Encounter (HOSPITAL_COMMUNITY): Payer: Self-pay

## 2022-11-15 DIAGNOSIS — Z1152 Encounter for screening for COVID-19: Secondary | ICD-10-CM | POA: Diagnosis not present

## 2022-11-15 DIAGNOSIS — M791 Myalgia, unspecified site: Secondary | ICD-10-CM | POA: Insufficient documentation

## 2022-11-15 DIAGNOSIS — J029 Acute pharyngitis, unspecified: Secondary | ICD-10-CM | POA: Diagnosis present

## 2022-11-15 DIAGNOSIS — J189 Pneumonia, unspecified organism: Secondary | ICD-10-CM | POA: Diagnosis not present

## 2022-11-15 LAB — CBC WITH DIFFERENTIAL/PLATELET
Abs Immature Granulocytes: 0.05 10*3/uL (ref 0.00–0.07)
Basophils Absolute: 0.1 10*3/uL (ref 0.0–0.1)
Basophils Relative: 0 %
Eosinophils Absolute: 0.1 10*3/uL (ref 0.0–0.5)
Eosinophils Relative: 1 %
HCT: 43.4 % (ref 36.0–46.0)
Hemoglobin: 14.1 g/dL (ref 12.0–15.0)
Immature Granulocytes: 0 %
Lymphocytes Relative: 16 %
Lymphs Abs: 2.4 10*3/uL (ref 0.7–4.0)
MCH: 30.3 pg (ref 26.0–34.0)
MCHC: 32.5 g/dL (ref 30.0–36.0)
MCV: 93.3 fL (ref 80.0–100.0)
Monocytes Absolute: 0.9 10*3/uL (ref 0.1–1.0)
Monocytes Relative: 6 %
Neutro Abs: 11.2 10*3/uL — ABNORMAL HIGH (ref 1.7–7.7)
Neutrophils Relative %: 77 %
Platelets: 329 10*3/uL (ref 150–400)
RBC: 4.65 MIL/uL (ref 3.87–5.11)
RDW: 13.2 % (ref 11.5–15.5)
WBC: 14.6 10*3/uL — ABNORMAL HIGH (ref 4.0–10.5)
nRBC: 0 % (ref 0.0–0.2)

## 2022-11-15 LAB — SARS CORONAVIRUS 2 BY RT PCR: SARS Coronavirus 2 by RT PCR: NEGATIVE

## 2022-11-15 LAB — BASIC METABOLIC PANEL
Anion gap: 7 (ref 5–15)
BUN: 11 mg/dL (ref 6–20)
CO2: 26 mmol/L (ref 22–32)
Calcium: 8.5 mg/dL — ABNORMAL LOW (ref 8.9–10.3)
Chloride: 106 mmol/L (ref 98–111)
Creatinine, Ser: 0.65 mg/dL (ref 0.44–1.00)
GFR, Estimated: >60 mL/min (ref >60)
Glucose, Bld: 126 mg/dL — ABNORMAL HIGH (ref 70–99)
Potassium: 3.3 mmol/L — ABNORMAL LOW (ref 3.5–5.1)
Sodium: 139 mmol/L (ref 135–145)

## 2022-11-15 MED ORDER — AMOXICILLIN-POT CLAVULANATE 875-125 MG PO TABS: 1.0000 | ORAL_TABLET | Freq: Two times a day (BID) | ORAL | 0 refills | Status: DC

## 2022-11-15 MED ORDER — AZITHROMYCIN 250 MG PO TABS
500.0000 mg | ORAL_TABLET | Freq: Every day | ORAL | Status: DC
  Administered 2022-11-15: 500 mg via ORAL
  Filled 2022-11-15: qty 2

## 2022-11-15 MED ORDER — AMOXICILLIN-POT CLAVULANATE 875-125 MG PO TABS
1.0000 | ORAL_TABLET | Freq: Once | ORAL | Status: AC
  Administered 2022-11-15: 1 via ORAL
  Filled 2022-11-15: qty 1

## 2022-11-15 MED ORDER — LIDOCAINE VISCOUS HCL 2 % MT SOLN
15.0000 mL | Freq: Once | OROMUCOSAL | Status: AC
  Administered 2022-11-15: 15 mL via OROMUCOSAL
  Filled 2022-11-15: qty 15

## 2022-11-15 MED ORDER — AZITHROMYCIN 250 MG PO TABS: 250.0000 mg | ORAL_TABLET | Freq: Every day | ORAL | 0 refills | Status: AC

## 2022-11-15 NOTE — ED Provider Notes (Signed)
[facility] EMERGENCY DEPARTMENT Provider Note   CSN: [id number] Arrival date & time: 11/15/22  6761     History  Chief Complaint  Patient presents with   Generalized Body Aches   Sore Throat    Cassandra Morgan is a 56 y.o. female.  HPI Presents with concern of cough, ear fullness, throat pain.  Onset was 4 days ago, since that time no difficulty medication.  No fever, no dyspnea, no chest pain, but there is ongoing cough, and worsening head fullness as well as sore throat.    Home Medications Prior to Admission medications   Medication Sig Start Date End Date Taking? Authorizing Provider  amoxicillin-clavulanate (AUGMENTIN) 875-125 MG tablet Take 1 tablet by mouth every 12 (twelve) hours. 11/15/22  Yes Gerhard Munch, MD  azithromycin (ZITHROMAX) 250 MG tablet Take 1 tablet (250 mg total) by mouth daily for 4 days. Take 1 every day until finished. 11/15/22 11/19/22 Yes Gerhard Munch, MD  atorvastatin (LIPITOR) 10 MG tablet Take 10 mg by mouth daily. 02/25/18   [provider]  CINNAMON PO Take by mouth.    [provider]  lisinopril (PRINIVIL,ZESTRIL) 2.5 MG tablet Take 2.5 mg by mouth daily.    [provider]  metFORMIN (GLUCOPHAGE) 1000 MG tablet Take 1,000 mg by mouth 2 (two) times daily.  Patient not taking: Reported on 07/12/2020 12/25/16   [provider]  UNABLE TO FIND Bitter melon    [provider]  valACYclovir (VALTREX) 1000 MG tablet Take twice a day for 5 days, and restart whenever symptomatic. 07/12/20   Marylene Land, CNM      Allergies    Patient has no known allergies.    Review of Systems   Review of Systems  All other systems reviewed and are negative.   Physical Exam Updated Vital Signs BP (!) 146/108   Pulse 92   Temp 98.4 F (36.9 C) (Oral)   Resp 16   Ht 5\' 5"  (1.651 m)   Wt 90.7 kg   SpO2 98%   BMI 33.28 kg/m  Physical Exam Vitals and nursing note  reviewed.  Constitutional:      General: She is not in acute distress.    Appearance: She is well-developed.  HENT:     Head: Normocephalic and atraumatic.     Left Ear: Tympanic membrane normal. No drainage, swelling or tenderness.  No middle ear effusion.  Eyes:     Conjunctiva/sclera: Conjunctivae normal.  Cardiovascular:     Rate and Rhythm: Normal rate and regular rhythm.  Pulmonary:     Effort: Pulmonary effort is normal. No respiratory distress.     Breath sounds: Normal breath sounds. No stridor.  Abdominal:     General: There is no distension.  Skin:    General: Skin is warm and dry.  Neurological:     Mental Status: She is alert and oriented to person, place, and time.     Cranial Nerves: No cranial nerve deficit.  Psychiatric:        Mood and Affect: Mood normal.     ED Results / Procedures / Treatments   Labs (all labs ordered are listed, but only abnormal results are displayed) Labs Reviewed  BASIC METABOLIC PANEL - Abnormal; Notable for the following components:      Result Value   Potassium 3.3 (*)    Glucose, Bld 126 (*)    Calcium 8.5 (*)    All other components within normal  limits  CBC WITH DIFFERENTIAL/PLATELET - Abnormal; Notable for the following components:   WBC 14.6 (*)    Neutro Abs 11.2 (*)    All other components within normal limits  SARS CORONAVIRUS 2 BY RT PCR    EKG None  Radiology CT CHEST WO CONTRAST  Result Date: 11/15/2022 CLINICAL DATA:  Abnormal x-ray. Patient reports cough and sore throat for 2 days. Generalized body aches. EXAM: CT CHEST WITHOUT CONTRAST TECHNIQUE: Multidetector CT imaging of the chest was performed following the standard protocol without IV contrast. RADIATION DOSE REDUCTION: This exam was performed according to the departmental dose-optimization program which includes automated exposure control, adjustment of the mA and/or kV according to patient size and/or use of iterative reconstruction technique.  COMPARISON:  Chest radiograph performed earlier on the same date. Dizziness ganglia FINDINGS: Cardiovascular: No significant vascular findings. Normal heart size. No pericardial effusion. Mediastinum/Nodes: No enlarged mediastinal or axillary lymph nodes. Thyroid gland, trachea, and esophagus demonstrate no significant findings. Lungs/Pleura: Bilateral lower lobe ground-glass and patchy lung opacities, left worse than the right concerning for pneumonia. No pleural effusion or pneumothorax. Upper Abdomen: No acute abnormality. Musculoskeletal: No chest wall mass or suspicious bone lesions identified. IMPRESSION: Bilateral lower lobe ground-glass and patchy lung opacities, left worse than the right, concerning for pneumonia. Follow-up examination to resolution is recommended. Electronically Signed   By: Larose Hires D.O.   On: 11/15/2022 10:28   DG Chest 2 View  Result Date: 11/15/2022 CLINICAL DATA:  Body aches. EXAM: CHEST - 2 VIEW COMPARISON:  None Available. FINDINGS: The cardiopericardial silhouette is within normal limits for size. Basilar atelectasis noted bilaterally. Subtle nodular density seen at the left base, projecting in the infrahilar region on the lateral film. No pleural effusion. The visualized bony structures of the thorax are unremarkable. IMPRESSION: Subtle nodular density at the left base, projecting in the infrahilar region on the lateral film. CT chest without contrast recommended to further evaluate. Basilar atelectasis without edema or focal airspace consolidation. Electronically Signed   By: Kennith Center M.D.   On: 11/15/2022 09:14    Procedures Procedures    Medications Ordered in ED Medications  azithromycin (ZITHROMAX) tablet 500 mg (has no administration in time range)  amoxicillin-clavulanate (AUGMENTIN) 875-125 MG per tablet 1 tablet (has no administration in time range)  lidocaine (XYLOCAINE) 2 % viscous mouth solution 15 mL (15 mLs Mouth/Throat Given 11/15/22 4136)     ED Course/ Medical Decision Making/ A&P                           Medical Decision Making Multiple medical issues presents with 3 days of influenza-like illness.  She is awake, alert, afebrile, but concern for influenza versus COVID versus pneumonia versus URI.  Patient received topical analgesics, labs, x-ray after initial evaluation.  Amount and/or Complexity of Data Reviewed Labs: ordered. Decision-making details documented in ED Course. Radiology: ordered. Decision-making details documented in ED Course.  Risk OTC drugs. Prescription drug management. Decision regarding hospitalization.  11:10 AM Patient awake, alert, speaking clearly.  I demonstrated the patient's CT scan to her at bedside.  CT performed after initial x-ray was abnormal.  Her vital signs remain unremarkable, she has no increased work of breathing, no hypoxia, hospitalization consideration given bilateral pneumonia, but she will be started on antibiotics, is appropriate for discharge.  No early evidence of bacteremia, sepsis, low suspicion for PE given absence of risk profile, and hypoxia.  No evidence  for ACS.         Final Clinical Impression(s) / ED Diagnoses Final diagnoses:  Pneumonia of both lower lobes due to infectious organism    Rx / DC Orders ED Discharge Orders          Ordered    azithromycin (ZITHROMAX) 250 MG tablet  Daily        11/15/22 1109    amoxicillin-clavulanate (AUGMENTIN) 875-125 MG tablet  Every 12 hours        11/15/22 1109              Carmin Muskrat, MD 11/15/22 1110

## 2022-11-15 NOTE — Discharge Instructions (Signed)
As discussed, you have been diagnosed with pneumonia.  Please obtain and complete your course of antibiotics.  Return here for concerning changes in your condition.

## 2022-11-15 NOTE — ED Triage Notes (Signed)
Pt arrived POV from home c/o generalized body aches and a horse voice since Monday but then today woke up with ringing in her ears and pt states she has a sore throat.

## 2022-12-10 ENCOUNTER — Ambulatory Visit
Admission: RE | Admit: 2022-12-10 | Discharge: 2022-12-10 | Disposition: A | Discharge status: Home / Self Care | Payer: 59 | Source: Ambulatory Visit | Attending: Family Medicine | Admitting: Family Medicine

## 2022-12-10 ENCOUNTER — Other Ambulatory Visit: Payer: Self-pay | Admitting: Family Medicine

## 2022-12-10 DIAGNOSIS — Z8701 Personal history of pneumonia (recurrent): Secondary | ICD-10-CM

## 2022-12-10 DIAGNOSIS — Z1231 Encounter for screening mammogram for malignant neoplasm of breast: Secondary | ICD-10-CM

## 2023-02-13 ENCOUNTER — Other Ambulatory Visit (HOSPITAL_BASED_OUTPATIENT_CLINIC_OR_DEPARTMENT_OTHER): Payer: Self-pay | Admitting: Pain Medicine

## 2023-02-13 DIAGNOSIS — E1165 Type 2 diabetes mellitus with hyperglycemia: Secondary | ICD-10-CM

## 2023-02-13 DIAGNOSIS — E7800 Pure hypercholesterolemia, unspecified: Secondary | ICD-10-CM

## 2023-02-13 DIAGNOSIS — Z8249 Family history of ischemic heart disease and other diseases of the circulatory system: Secondary | ICD-10-CM

## 2023-02-28 ENCOUNTER — Ambulatory Visit (HOSPITAL_COMMUNITY)
Admission: RE | Admit: 2023-02-28 | Discharge: 2023-02-28 | Disposition: A | Discharge status: Home / Self Care | Payer: Self-pay | Source: Ambulatory Visit | Attending: Pain Medicine | Admitting: Pain Medicine

## 2023-02-28 DIAGNOSIS — E1165 Type 2 diabetes mellitus with hyperglycemia: Secondary | ICD-10-CM | POA: Insufficient documentation

## 2023-02-28 DIAGNOSIS — Z8249 Family history of ischemic heart disease and other diseases of the circulatory system: Secondary | ICD-10-CM | POA: Insufficient documentation

## 2023-02-28 DIAGNOSIS — E7800 Pure hypercholesterolemia, unspecified: Secondary | ICD-10-CM | POA: Insufficient documentation

## 2023-04-01 ENCOUNTER — Ambulatory Visit: Payer: 59 | Admitting: Internal Medicine

## 2023-09-06 ENCOUNTER — Encounter: Payer: Self-pay | Admitting: Internal Medicine

## 2023-09-06 ENCOUNTER — Ambulatory Visit: Payer: 59 | Admitting: Internal Medicine

## 2023-09-06 VITALS — BP 112/68 | HR 79 | Temp 98.0°F | Ht 65.0 in | Wt 202.0 lb

## 2023-09-06 DIAGNOSIS — E118 Type 2 diabetes mellitus with unspecified complications: Secondary | ICD-10-CM | POA: Insufficient documentation

## 2023-09-06 DIAGNOSIS — E1169 Type 2 diabetes mellitus with other specified complication: Secondary | ICD-10-CM

## 2023-09-06 DIAGNOSIS — Z7985 Long-term (current) use of injectable non-insulin antidiabetic drugs: Secondary | ICD-10-CM

## 2023-09-06 DIAGNOSIS — E785 Hyperlipidemia, unspecified: Secondary | ICD-10-CM

## 2023-09-06 LAB — MICROALBUMIN / CREATININE URINE RATIO
Creatinine,U: 44.4 mg/dL
Microalb Creat Ratio: 1.6 mg/g (ref 0.0–30.0)
Microalb, Ur: <0.7 mg/dL (ref 0.0–1.9)

## 2023-09-06 LAB — LIPID PANEL
Cholesterol: 147 mg/dL (ref 0–200)
HDL: 67.5 mg/dL (ref >39.00)
LDL Cholesterol: 70 mg/dL (ref 0–99)
NonHDL: 79.89
Total CHOL/HDL Ratio: 2
Triglycerides: 49 mg/dL (ref 0.0–149.0)
VLDL: 9.8 mg/dL (ref 0.0–40.0)

## 2023-09-06 LAB — CBC
HCT: 43.4 % (ref 36.0–46.0)
Hemoglobin: 14.2 g/dL (ref 12.0–15.0)
MCHC: 32.7 g/dL (ref 30.0–36.0)
MCV: 93.4 fl (ref 78.0–100.0)
Platelets: 338 10*3/uL (ref 150.0–400.0)
RBC: 4.64 Mil/uL (ref 3.87–5.11)
RDW: 13.8 % (ref 11.5–15.5)
WBC: 7.7 10*3/uL (ref 4.0–10.5)

## 2023-09-06 LAB — COMPREHENSIVE METABOLIC PANEL
ALT: 12 U/L (ref 0–35)
AST: 15 U/L (ref 0–37)
Albumin: 3.8 g/dL (ref 3.5–5.2)
Alkaline Phosphatase: 121 U/L — ABNORMAL HIGH (ref 39–117)
BUN: 15 mg/dL (ref 6–23)
CO2: 30 meq/L (ref 19–32)
Calcium: 9.3 mg/dL (ref 8.4–10.5)
Chloride: 101 meq/L (ref 96–112)
Creatinine, Ser: 0.6 mg/dL (ref 0.40–1.20)
GFR: 101.29 mL/min (ref >60.00)
Glucose, Bld: 148 mg/dL — ABNORMAL HIGH (ref 70–99)
Potassium: 3.9 meq/L (ref 3.5–5.1)
Sodium: 137 meq/L (ref 135–145)
Total Bilirubin: 0.6 mg/dL (ref 0.2–1.2)
Total Protein: 7 g/dL (ref 6.0–8.3)

## 2023-09-06 LAB — HEMOGLOBIN A1C: Hgb A1c MFr Bld: 7.5 % — ABNORMAL HIGH (ref 4.6–6.5)

## 2023-09-06 NOTE — Assessment & Plan Note (Signed)
Checking lipid panel and adjust lipitor 10 mg daily as needed.

## 2023-09-06 NOTE — Assessment & Plan Note (Signed)
Checking HgA1c, foot exam done, microalbumin to creatinine ratio, lipid panel. Adjust as needed. Taking ozempic 0.25 mg weekly. Advised we can increase if needed.

## 2023-09-06 NOTE — Patient Instructions (Signed)
We will check the labs today.

## 2023-09-06 NOTE — Progress Notes (Signed)
Subjective:   Patient ID: Cassandra Morgan, female    DOB: 1966/02/04, 57 y.o.   MRN: [id number]  HPI The patient is a new 57 YO female coming in for ongoing care. See A/P for details.   PMH FMH, social history reviewed and updated  Review of Systems  Constitutional: Negative.   HENT: Negative.    Eyes: Negative.   Respiratory:  Negative for cough, chest tightness and shortness of breath.   Cardiovascular:  Negative for chest pain, palpitations and leg swelling.  Gastrointestinal:  Negative for abdominal distention, abdominal pain, constipation, diarrhea, nausea and vomiting.  Musculoskeletal: Negative.   Skin: Negative.   Neurological: Negative.   Psychiatric/Behavioral: Negative.      Objective:  Physical Exam Constitutional:      Appearance: She is well-developed.  HENT:     Head: Normocephalic and atraumatic.  Cardiovascular:     Rate and Rhythm: Normal rate and regular rhythm.  Pulmonary:     Effort: Pulmonary effort is normal. No respiratory distress.     Breath sounds: Normal breath sounds. No wheezing or rales.  Abdominal:     General: Bowel sounds are normal. There is no distension.     Palpations: Abdomen is soft.     Tenderness: There is no abdominal tenderness. There is no rebound.  Musculoskeletal:     Cervical back: Normal range of motion.  Skin:    General: Skin is warm and dry.  Neurological:     Mental Status: She is alert and oriented to person, place, and time.     Coordination: Coordination normal.     Vitals:   09/06/23 0938  BP: 112/68  Pulse: 79  Temp: 98 F (36.7 C)  TempSrc: Oral  SpO2: 94%  Weight: 202 lb (91.6 kg)  Height: 5\' 5"  (1.651 m)    Assessment & Plan:

## 2023-09-09 ENCOUNTER — Telehealth: Payer: Self-pay | Admitting: Internal Medicine

## 2023-09-09 NOTE — Telephone Encounter (Signed)
This was addressed through results notes

## 2023-09-09 NOTE — Telephone Encounter (Signed)
Patient returned Cassandra Morgan's call about her lab results. She would like a call back at [phone]. She said she goes to work at 4:00 today.

## 2023-09-09 NOTE — Telephone Encounter (Signed)
Already spoke with patient about labs.

## 2023-09-10 ENCOUNTER — Other Ambulatory Visit: Payer: Self-pay | Admitting: Internal Medicine

## 2023-09-10 MED ORDER — OZEMPIC (0.25 OR 0.5 MG/DOSE) 2 MG/3ML ~~LOC~~ SOPN: 2.0000 mg | PEN_INJECTOR | SUBCUTANEOUS | 2 refills | Status: DC

## 2023-09-10 MED ORDER — SEMAGLUTIDE (1 MG/DOSE) 4 MG/3ML ~~LOC~~ SOPN: 1.0000 mg | PEN_INJECTOR | SUBCUTANEOUS | 0 refills | Status: DC

## 2023-09-20 IMAGING — MG MM DIGITAL SCREENING BILAT W/ TOMO AND CAD
8 series · 8 of 24 positions shown · non-contrast
Comparison: Previous exam(s).

CLINICAL DATA: Screening.

EXAM:
DIGITAL SCREENING BILATERAL MAMMOGRAM WITH TOMOSYNTHESIS AND CAD
TECHNIQUE: Bilateral screening digital craniocaudal and mediolateral oblique
mammograms were obtained. Bilateral screening digital breast
tomosynthesis was performed. The images were evaluated with
computer-aided detection.

[L MLO synth-2D]
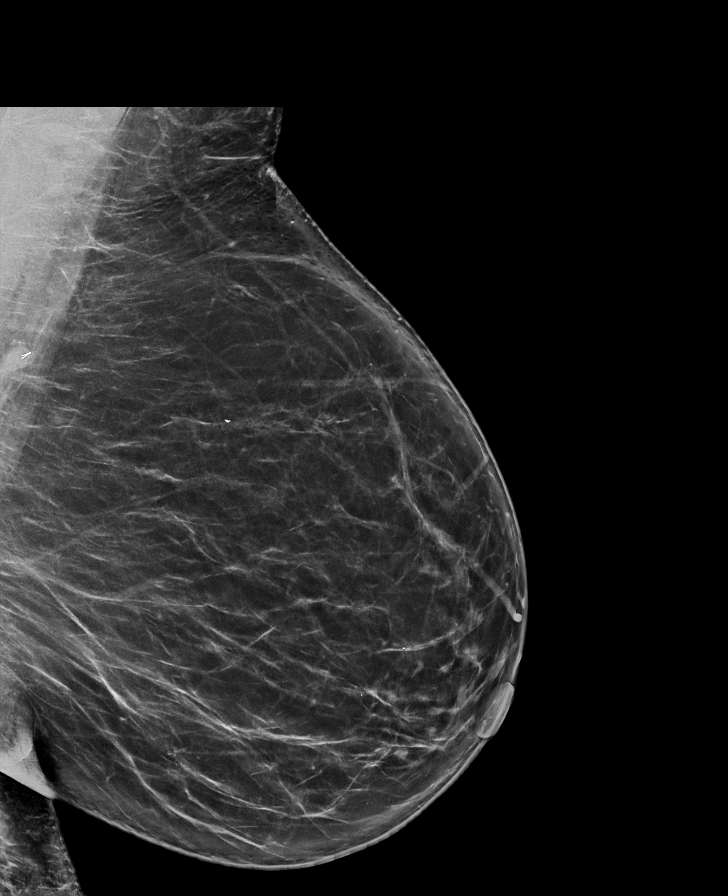

[R MLO synth-2D]
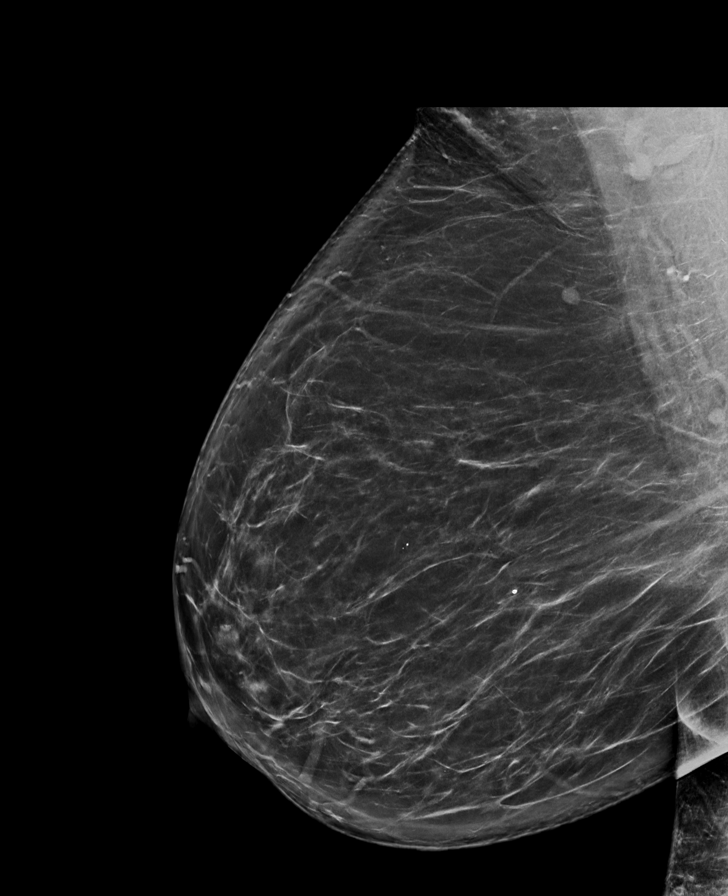

[R CC synth-2D]
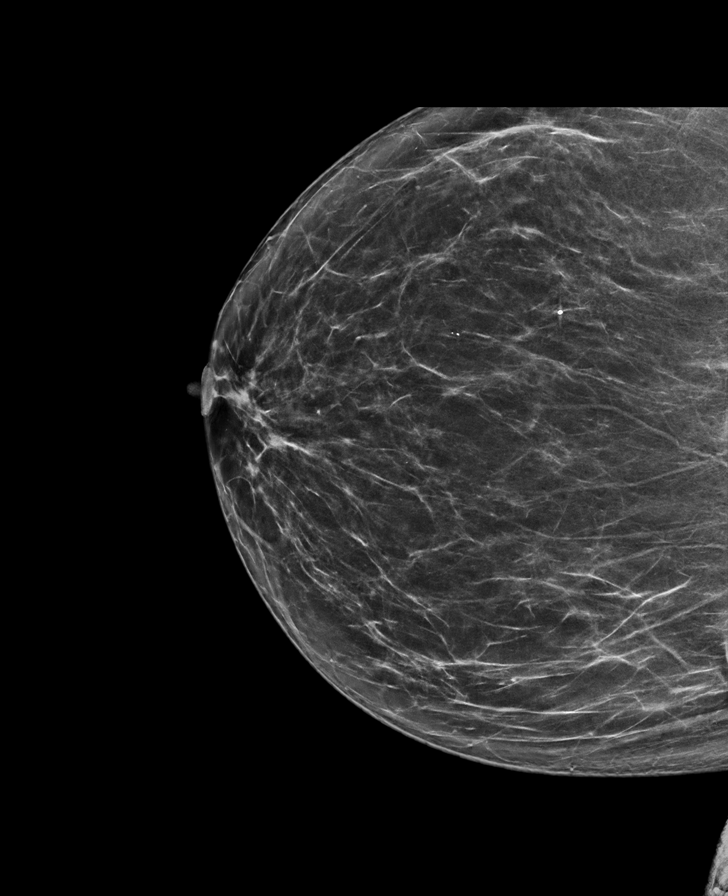

[L CC synth-2D]
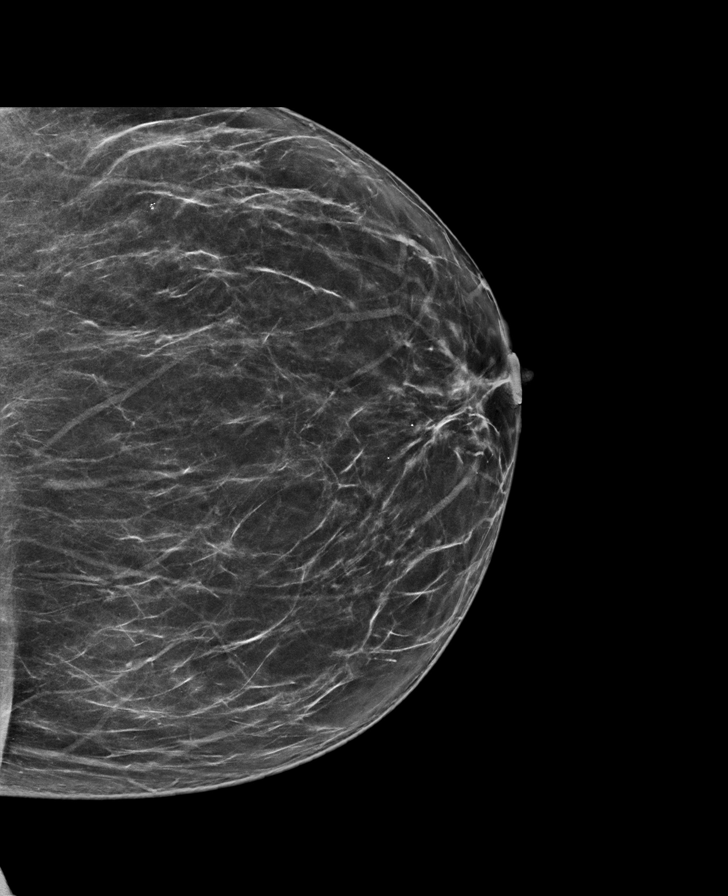

[L CC tomo · tomo slice 38/75.0]
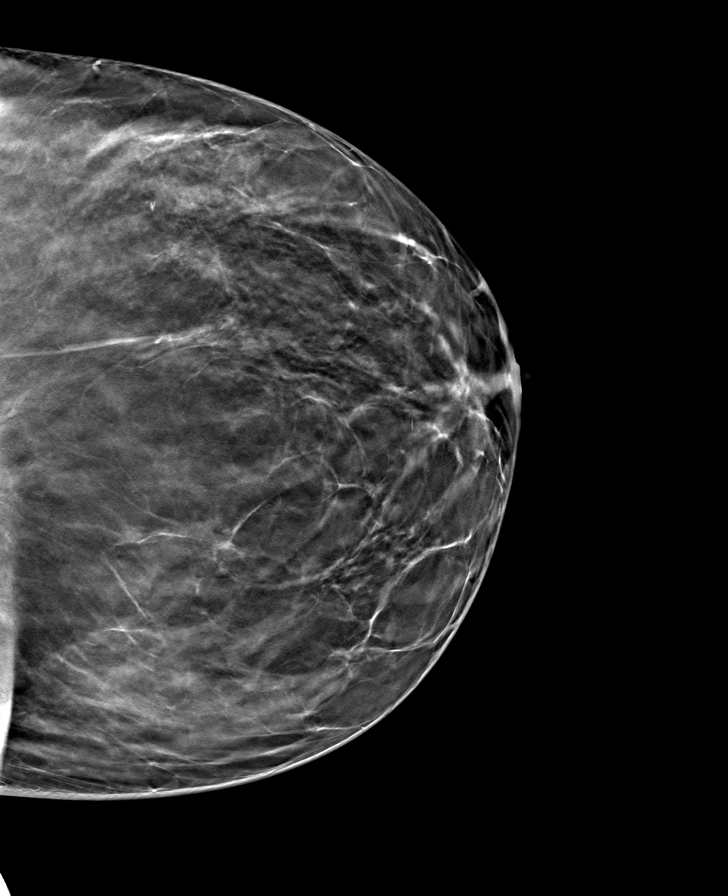

[R MLO tomo · tomo slice 48/95.0]
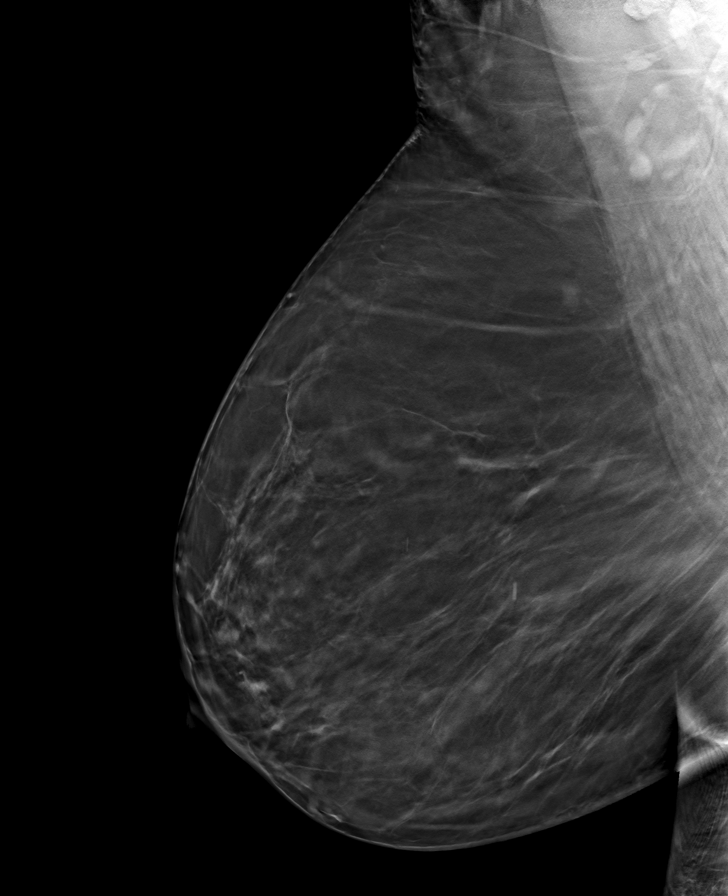

[R CC tomo · tomo slice 39/78.0]
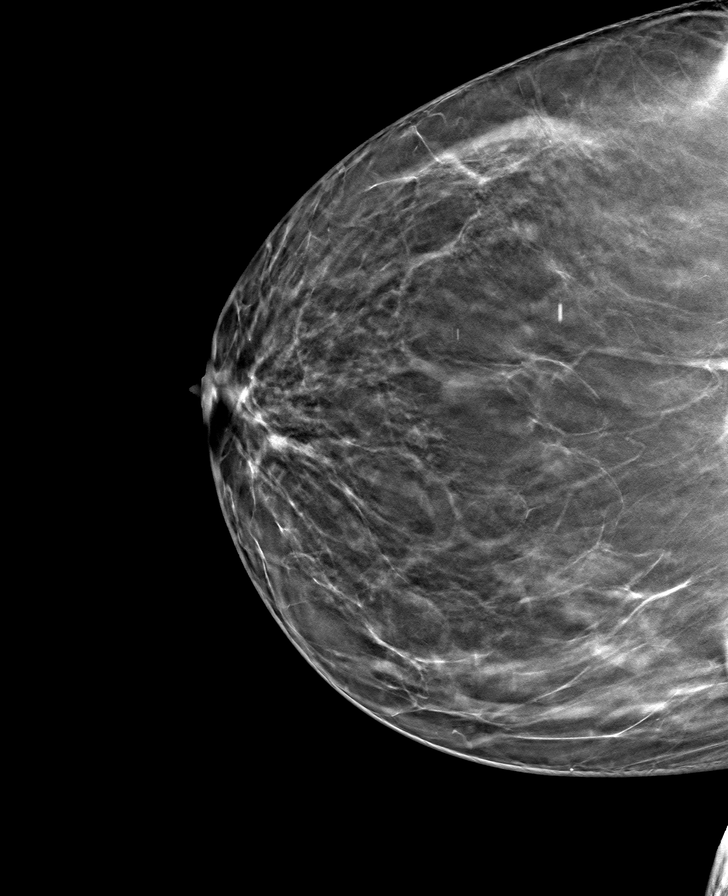

[L MLO tomo · tomo slice 47/93.0]
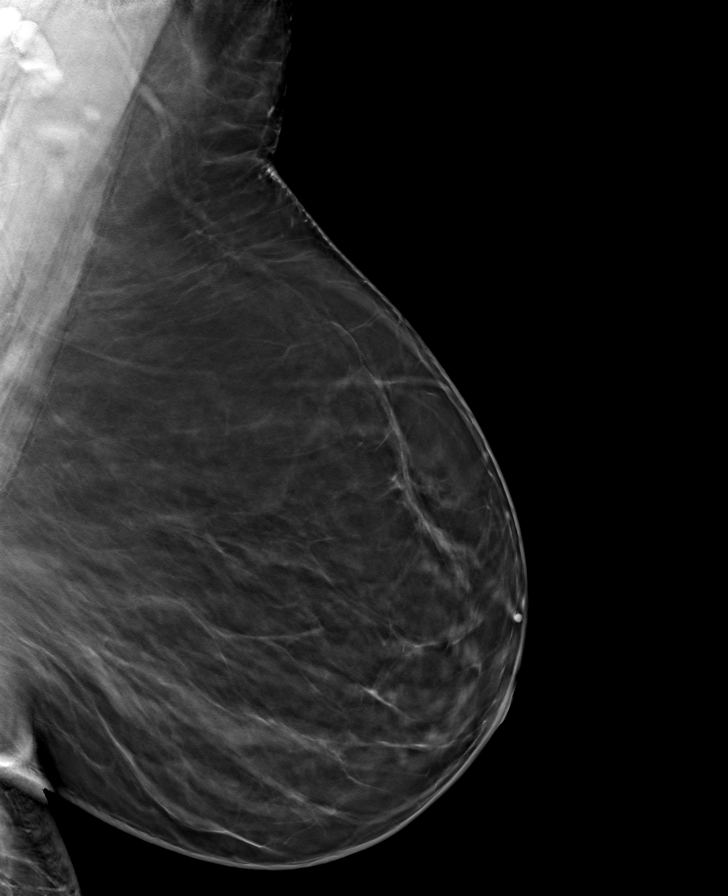

[8 of 24 positions shown; findings below may reference images not displayed]

ACR Breast Density Category b: There are scattered areas of
fibroglandular density.
FINDINGS: There are no findings suspicious for malignancy.
IMPRESSION: No mammographic evidence of malignancy. A result letter of this
screening mammogram will be mailed directly to the patient.

RECOMMENDATION:
Screening mammogram in one year. (Code:51-O-LD2)

BI-RADS CATEGORY  1: Negative.

## 2023-11-13 ENCOUNTER — Other Ambulatory Visit: Payer: Self-pay | Admitting: Family Medicine

## 2023-11-13 DIAGNOSIS — Z0000 Encounter for general adult medical examination without abnormal findings: Secondary | ICD-10-CM

## 2023-12-12 ENCOUNTER — Ambulatory Visit
Admission: RE | Admit: 2023-12-12 | Discharge: 2023-12-12 | Disposition: A | Discharge status: Home / Self Care | Payer: 59 | Source: Ambulatory Visit | Attending: Family Medicine | Admitting: Family Medicine

## 2023-12-12 ENCOUNTER — Other Ambulatory Visit: Payer: Self-pay | Admitting: Internal Medicine

## 2023-12-12 DIAGNOSIS — Z0000 Encounter for general adult medical examination without abnormal findings: Secondary | ICD-10-CM

## 2023-12-23 ENCOUNTER — Encounter: Payer: Self-pay | Admitting: Internal Medicine

## 2023-12-23 LAB — HM MAMMOGRAPHY

## 2024-06-09 ENCOUNTER — Other Ambulatory Visit: Payer: Self-pay | Admitting: Family

## 2024-06-09 ENCOUNTER — Telehealth: Payer: Self-pay

## 2024-06-09 MED ORDER — ATORVASTATIN CALCIUM 10 MG PO TABS: 10.0000 mg | ORAL_TABLET | Freq: Every day | ORAL | 0 refills | Status: DC

## 2024-06-09 NOTE — Telephone Encounter (Signed)
**Note De-identified  [place] Obfuscation** Please advise 

## 2024-06-09 NOTE — Telephone Encounter (Signed)
 Copied from CRM [id number]. Topic: Clinical - Medication Question >> Jun 08, 2024  1:29 PM Lajean Pike wrote: Reason for CRM: Patient recently lost job in February and has a hearing in March. Right now she doesn't have insurance but will be applying for Medicaid tomorrow. Patient is concerned about Lipitor refill. She says she only has about 20 pills left and would like to know what are her next steps in this process, and how would she go about having her script filled when they run out. Patient would appreciate a call back [phone].

## 2024-07-03 LAB — COMPREHENSIVE METABOLIC PANEL WITH GFR
Albumin: 4.3 (ref 3.5–5.0)
Calcium: 9.3 (ref 8.7–10.7)
eGFR: >90

## 2024-07-03 LAB — PROTEIN / CREATININE RATIO, URINE
Albumin, U: <7
Creatinine, Urine: 61

## 2024-07-03 LAB — BASIC METABOLIC PANEL WITH GFR
BUN: 14 (ref 4–21)
CO2: 28 — AB (ref 13–22)
Chloride: 101 (ref 99–108)
Creatinine: 0.6 (ref 0.5–1.1)
Glucose: 123
Potassium: 4.2 meq/L (ref 3.5–5.1)
Sodium: 137 (ref 137–147)

## 2024-07-03 LAB — HEMOGLOBIN A1C: Hemoglobin A1C: 6.8

## 2024-07-03 LAB — LIPID PANEL: LDL Cholesterol: 91

## 2024-07-03 LAB — TSH: TSH: 1.22 (ref 0.41–5.90)

## 2024-07-03 LAB — HEPATIC FUNCTION PANEL
ALT: 7 U/L (ref 7–35)
AST: 13 (ref 13–35)
Alkaline Phosphatase: 120 (ref 25–125)
Bilirubin, Total: 0.6

## 2024-10-12 ENCOUNTER — Ambulatory Visit (INDEPENDENT_AMBULATORY_CARE_PROVIDER_SITE_OTHER): Admitting: Internal Medicine

## 2024-10-12 ENCOUNTER — Encounter: Payer: Self-pay | Admitting: Internal Medicine

## 2024-10-12 VITALS — BP 100/60 | HR 83 | Temp 97.6°F | Ht 65.0 in | Wt 193.2 lb

## 2024-10-12 DIAGNOSIS — E118 Type 2 diabetes mellitus with unspecified complications: Secondary | ICD-10-CM

## 2024-10-12 DIAGNOSIS — E1169 Type 2 diabetes mellitus with other specified complication: Secondary | ICD-10-CM

## 2024-10-12 DIAGNOSIS — F4323 Adjustment disorder with mixed anxiety and depressed mood: Secondary | ICD-10-CM

## 2024-10-12 DIAGNOSIS — Z23 Encounter for immunization: Secondary | ICD-10-CM | POA: Diagnosis not present

## 2024-10-12 DIAGNOSIS — F5102 Adjustment insomnia: Secondary | ICD-10-CM

## 2024-10-12 DIAGNOSIS — Z7985 Long-term (current) use of injectable non-insulin antidiabetic drugs: Secondary | ICD-10-CM | POA: Diagnosis not present

## 2024-10-12 DIAGNOSIS — M25551 Pain in right hip: Secondary | ICD-10-CM

## 2024-10-12 DIAGNOSIS — E785 Hyperlipidemia, unspecified: Secondary | ICD-10-CM

## 2024-10-12 MED ORDER — ATORVASTATIN CALCIUM 10 MG PO TABS: 10.0000 mg | ORAL_TABLET | Freq: Every day | ORAL | 3 refills | Status: AC

## 2024-10-12 NOTE — Progress Notes (Unsigned)
 Subjective:   Patient ID: Cassandra Morgan, female    DOB: 1966/01/08, 58 y.o.   MRN: [id number]  Discussed the use of AI scribe software for clinical note transcription with the patient, who gave verbal consent to proceed.  History of Present Illness Cassandra Morgan is a 58 year old female who presents with anxiety and hip pain.  She has been experiencing significant anxiety following a series of stressful events, including job termination in March 2025 after 31 years of service. The anxiety is particularly associated with her proximity to trains, which reminds her of her previous job. She has not had an anxiety attack in about two months and has been attending mental health classes and therapy, which she finds helpful. She engages in physical activities like walking to manage her anxiety.  She describes a fall at home on May 23, 2024, where she tripped over a bag and landed on her hip, resulting in a significant bruise. The bruise has mostly resolved, but she reports persistent aching in the hip area. She has been using ice, heat, and massage to manage the symptoms. The pain occasionally worsens, especially during activities like cooking.  She mentions a history of dental issues, including multiple surgeries for an abscess related to a crown, which she believes may have recurred.  She has a supportive social network, including family and friends who have helped her through difficult times, such as the loss of her mother in March. She has been actively seeking new employment and has started a new job with the city of [place] in August 2025, which she finds fulfilling despite some anxiety triggers related to her previous employment.  She reports difficulty sleeping, often feeling tired but unable to fall asleep. She has not yet tried any sleep aids but is considering melatonin. She is mindful of her caffeine intake, especially in the afternoons.   Review of Systems   Constitutional: Negative.   HENT: Negative.    Eyes: Negative.   Respiratory:  Negative for cough, chest tightness and shortness of breath.   Cardiovascular:  Negative for chest pain, palpitations and leg swelling.  Gastrointestinal:  Negative for abdominal distention, abdominal pain, constipation, diarrhea, nausea and vomiting.  Musculoskeletal:  Positive for myalgias.  Skin: Negative.   Neurological: Negative.   Psychiatric/Behavioral:  Positive for decreased concentration, dysphoric mood and sleep disturbance. The patient is nervous/anxious.     Objective:  Physical Exam Constitutional:      Appearance: She is well-developed.  HENT:     Head: Normocephalic and atraumatic.  Cardiovascular:     Rate and Rhythm: Normal rate and regular rhythm.  Pulmonary:     Effort: Pulmonary effort is normal. No respiratory distress.     Breath sounds: Normal breath sounds. No wheezing or rales.  Abdominal:     General: Bowel sounds are normal. There is no distension.     Palpations: Abdomen is soft.     Tenderness: There is no abdominal tenderness.  Musculoskeletal:        General: Tenderness present.     Cervical back: Normal range of motion.     Comments: Right trochanter bursa tenderness mild  Skin:    General: Skin is warm and dry.  Neurological:     Mental Status: She is alert and oriented to person, place, and time.     Coordination: Coordination normal.     Vitals:   10/12/24 1551  BP: 100/60  Pulse: 83  Temp: 97.6 F (36.4 C)  TempSrc: Oral  SpO2: 98%  Weight: 193 lb 3.2 oz (87.6 kg)  Height: 5' 5 (1.651 m)    Assessment and Plan Assessment & Plan Anxiety and Depression   Symptoms have improved with therapy and support but are not fully resolved. No anxiety attacks have occurred in two weeks. Continue attending mental health class and engage in positive coping strategies such as walking and spending time in nature. She does not want to try medication at this time  due to improvement recently. We discussed if she feels differently about that to let us  know.   Right hip pain after fall   Pain and swelling persist, likely due to deep muscle bruising or bursitis, and are noticeable with certain activities. Provide stretching exercises for the hip and advise massage to help reduce swelling and discomfort.  Insomnia   Intermittent insomnia may be related to anxiety and caffeine intake. Advise reducing caffeine intake, especially in the afternoon, and consider a trial of melatonin for sleep.  General Health Maintenance    Administer the flu shot.

## 2024-10-13 DIAGNOSIS — F4320 Adjustment disorder, unspecified: Secondary | ICD-10-CM | POA: Insufficient documentation

## 2024-10-13 DIAGNOSIS — G4700 Insomnia, unspecified: Secondary | ICD-10-CM | POA: Insufficient documentation

## 2024-10-13 DIAGNOSIS — M25551 Pain in right hip: Secondary | ICD-10-CM | POA: Insufficient documentation

## 2024-10-13 NOTE — Assessment & Plan Note (Signed)
 Labs up to date and at goal with endo. Foot exam done today. Continue ozempic  2 mg weekly

## 2024-10-13 NOTE — Assessment & Plan Note (Signed)
 Intermittent insomnia may be related to anxiety and caffeine intake. Advise reducing caffeine intake, especially in the afternoon, and consider a trial of melatonin for sleep.

## 2024-10-13 NOTE — Assessment & Plan Note (Signed)
 Pain and swelling persist, likely due to deep muscle bruising or bursitis, and are noticeable with certain activities. Provide stretching exercises for the hip and advise massage to help reduce swelling and discomfort.

## 2024-10-13 NOTE — Assessment & Plan Note (Signed)
 Taking lipitor 10 mg daily and reviewed recent labs with endo. This is at goal renewed for 1 year.

## 2024-10-13 NOTE — Assessment & Plan Note (Signed)
 Symptoms have improved with therapy and support but are not fully resolved. No anxiety attacks have occurred in two weeks. Continue attending mental health class and engage in positive coping strategies such as walking and spending time in nature. She does not want to try medication at this time due to improvement recently. We discussed if she feels differently about that to let us  know.

## 2024-11-02 ENCOUNTER — Ambulatory Visit: Admitting: Podiatry

## 2024-11-30 ENCOUNTER — Ambulatory Visit (INDEPENDENT_AMBULATORY_CARE_PROVIDER_SITE_OTHER): Admitting: Podiatry

## 2024-11-30 DIAGNOSIS — L84 Corns and callosities: Secondary | ICD-10-CM

## 2024-11-30 DIAGNOSIS — M205X1 Other deformities of toe(s) (acquired), right foot: Secondary | ICD-10-CM

## 2024-11-30 NOTE — Progress Notes (Signed)
 Subjective:   Patient ID: Cassandra Morgan, female   DOB: 58 y.o.   MRN: [id number]   HPI Chief Complaint  Patient presents with   Callouses    Right foot 5th toe corn    58 year old female presents the office with above concerns.  She has noticed a corn on the side of her right fifth toe.  She says even hurt over the summer without putting too much pressure on the toe.  She does not report any recent injuries or any recent treatment.  She does not report any open lesions.  No other concerns today.   Review of Systems  All other systems reviewed and are negative.  Past Medical History:  Diagnosis Date   Diabetes [facility])    diagnosed as prediabetic 2013   Hypertension     Past Surgical History:  Procedure Laterality Date   INGUINAL HERNIA REPAIR Right 1987   TUBAL LIGATION       Current Outpatient Medications:    atorvastatin  (LIPITOR) 10 MG tablet, Take 1 tablet (10 mg total) by mouth daily., Disp: 90 tablet, Rfl: 3   CINNAMON PO, Take by mouth. (Patient not taking: Reported on 10/12/2024), Disp: , Rfl:    Semaglutide  (OZEMPIC , 2 MG/DOSE, [place]), Inject 2 mg into the skin once a week., Disp: , Rfl:    UNABLE TO FIND, Bitter melon, Disp: , Rfl:    valACYclovir  (VALTREX ) 1000 MG tablet, Take twice a day for 5 days, and restart whenever symptomatic. (Patient taking differently: Take 1,000 mg by mouth daily as needed. Take twice a day for 5 days, and restart whenever symptomatic.), Disp: 30 tablet, Rfl: 5  No Known Allergies        Objective:  Physical Exam  General: AAO x3, NAD  Dermatological: Hyperkeratotic lesion on the dorsal lateral aspect of the right fifth toe without any underlying ulceration, drainage or signs of infection.  No other open lesions are identified at this time.  Vascular: Dorsalis Pedis artery and Posterior Tibial artery pedal pulses are 2/4 bilateral with immedate capillary fill time. There is no pain with calf compression, swelling, warmth,  erythema.   Neruologic: Grossly intact via light touch bilateral.   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  Tenderness palpation along the PIPJ.  No other areas of discomfort.       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 - Sharply debrided the hyperkeratotic lesion without any complications or bleeding.  Discussed offloading.  Discussed offloading pads. -Discussed shoe modifications to avoid excess pressure -We discussed surgery as well as postoperative course.  Return if symptoms worsen or fail to improve.  Donnice JONELLE Fees DPM

## 2025-02-08 ENCOUNTER — Ambulatory Visit: Admitting: Internal Medicine
